# Patient Record
Sex: Male | Born: 1960 | Race: White | Hispanic: No | Marital: Single | Smoking: Current every day smoker
Health system: Southern US, Community
[De-identification: ages and names within clinical notes are randomized; demographics above are authoritative.]

## PROBLEM LIST (undated history)

## (undated) DIAGNOSIS — I2510 Atherosclerotic heart disease of native coronary artery without angina pectoris: Secondary | ICD-10-CM

## (undated) DIAGNOSIS — E785 Hyperlipidemia, unspecified: Secondary | ICD-10-CM

## (undated) DIAGNOSIS — I10 Essential (primary) hypertension: Secondary | ICD-10-CM

## (undated) HISTORY — DX: Hyperlipidemia, unspecified: E78.5

## (undated) HISTORY — PX: CORONARY ANGIOPLASTY WITH STENT PLACEMENT: SHX49

## (undated) HISTORY — DX: Essential (primary) hypertension: I10

---

## 2007-07-31 ENCOUNTER — Encounter: Admission: RE | Admit: 2007-07-31 | Discharge: 2007-07-31 | Payer: Self-pay | Admitting: Family Medicine

## 2009-11-29 ENCOUNTER — Ambulatory Visit: Payer: Self-pay | Admitting: Surgery

## 2010-05-23 ENCOUNTER — Ambulatory Visit: Payer: Self-pay | Admitting: Surgery

## 2010-08-16 NOTE — Assessment & Plan Note (Signed)
OFFICE VISIT   [name], [name]  DOB:  1960/09/12                                       11/29/2009  [id number]#:[id number]   REASON FOR VISIT:  Leg pain.   CHIEF COMPLAINT:  Leg pain.   HISTORY:  This is a 50 year old gentleman seen at the request of Dr.  Andrey Campanile for evaluation of leg pain.  The patient states that for the past  6 months he has been having problems  in his legs, the right leg being  greater than the left.  He states that he develops a sharp type pain in  his right flank and groin area that is mostly associated with walking  approximately 50 feet.  He also complains of a dull pain in his feet  that mainly happens at night.  His symptoms are alleviated with rest.  They are aggravated with walking.  When they come about, they usually go  away within a couple minutes, however, his pain has been noted to last  for several hours.   The patient suffers from hypertension and hypercholesterolemia, both  which are medically managed.  He has already undergone a coronary stent  placement.  He continues to be a smoker approximately 1 pack a day.   REVIEW OF SYSTEMS:  VASCULAR:  Positive for pain in the legs walking and  lying flat.  CARDIAC:  Positive for chest pain.  GI:  Positive for reflux.  NEURO:  Positive for headaches.  GU:  Positive for frequent urination.  ENT:  Positive for recent change in eyesight.  MUSCULOSKELETAL:  Positive for arthritis, joint pain, muscle pain.  PSYCH:  Positive for anxiety.   PAST MEDICAL HISTORY:  Hypertension, hypercholesterolemia, coronary  artery disease, degenerative back disease.   PAST SURGICAL HISTORY:  Coronary stent.   SOCIAL HISTORY:  He is single, disabled.  Smokes a pack a day, also  drinks alcohol regularly.   FAMILY HISTORY:  Positive for heart attack in his mother.   MEDICATIONS:  Include aspirin, Ativan, lisinopril/hydrochlorothiazide,  Loxitane, metoprolol, omeprazole, Plavix, simvastatin.   ALLERGIES:  None.   PHYSICAL EXAMINATION:  Heart rate 61, blood pressure 110/75, temperature  is 98.0.  GENERAL:  He is well-appearing, in no distress.  HEENT:  Within normal limits.  LUNGS:  Clear bilaterally.  CARDIOVASCULAR:  Regular rate and rhythm.  No murmur.  No carotid  bruits.  Pedal pulses are palpable.  Femoral pulses are 2+ and equal.  ABDOMEN:  Soft, nontender.  No pulsatile mass.  No hepatosplenomegaly.  MUSCULOSKELETAL:  Without major deformity or cyanosis.  NEURO:  He has no focal deficits.  SKIN:  Without rash.   DIAGNOSTICS:  I have ordered and independently reviewed his ultrasound.  This reveals an ankle brachial index of 1.0 on the right and 1.1 on the  left.  He has triphasic pedal waveforms.   ASSESSMENT/PLAN:  Bilateral leg pain.   PLAN:  I discussed today with the patient that the likelihood of his  symptoms being related to inadequate circulation is very low.  He comes  today with reports of significant degenerative back disease and  suspicion is that most of his complaints are related to his back and  disk disease.  I will plan on seeing him back in 6 months.  In the  interval, he is going to consider further physical therapy  versus  injections in his back.  If he has had no resolution of his pain even  with adequate management of his back disease, I would consider  proceeding with arteriogram to completely image his lower extremity  vasculature and to potentially completely exclude vascular  disease as  the source of his problems.   I did encourage the patient to consider cessation of tobacco as this is  certainly not helping his long-term risk stratification.  I spent  approximately 4 minutes talking to him about smoking cessation.  He  states that he is trying to quit.  I will plan on seeing the patient  back in 6 months.     Jorge Ny, MD  Electronically Signed   VWB/MEDQ  D:  11/29/2009  T:  11/30/2009  Job:  [phone]

## 2013-10-11 ENCOUNTER — Emergency Department (HOSPITAL_COMMUNITY): Payer: Medicaid Other

## 2013-10-11 ENCOUNTER — Encounter (HOSPITAL_COMMUNITY): Payer: Self-pay | Admitting: Emergency Medicine

## 2013-10-11 ENCOUNTER — Emergency Department (HOSPITAL_COMMUNITY)
Admission: EM | Admit: 2013-10-11 | Discharge: 2013-10-11 | Disposition: A | Discharge status: Home / Self Care | Payer: Medicaid Other | Attending: Emergency Medicine | Admitting: Emergency Medicine

## 2013-10-11 DIAGNOSIS — Z23 Encounter for immunization: Secondary | ICD-10-CM | POA: Diagnosis not present

## 2013-10-11 DIAGNOSIS — S46909A Unspecified injury of unspecified muscle, fascia and tendon at shoulder and upper arm level, unspecified arm, initial encounter: Secondary | ICD-10-CM | POA: Diagnosis not present

## 2013-10-11 DIAGNOSIS — S2232XA Fracture of one rib, left side, initial encounter for closed fracture: Secondary | ICD-10-CM

## 2013-10-11 DIAGNOSIS — S99929A Unspecified injury of unspecified foot, initial encounter: Secondary | ICD-10-CM

## 2013-10-11 DIAGNOSIS — F17200 Nicotine dependence, unspecified, uncomplicated: Secondary | ICD-10-CM | POA: Diagnosis not present

## 2013-10-11 DIAGNOSIS — I2510 Atherosclerotic heart disease of native coronary artery without angina pectoris: Secondary | ICD-10-CM | POA: Diagnosis not present

## 2013-10-11 DIAGNOSIS — S0990XA Unspecified injury of head, initial encounter: Secondary | ICD-10-CM | POA: Diagnosis present

## 2013-10-11 DIAGNOSIS — IMO0002 Reserved for concepts with insufficient information to code with codable children: Secondary | ICD-10-CM | POA: Diagnosis not present

## 2013-10-11 DIAGNOSIS — S01501A Unspecified open wound of lip, initial encounter: Secondary | ICD-10-CM | POA: Insufficient documentation

## 2013-10-11 DIAGNOSIS — S99919A Unspecified injury of unspecified ankle, initial encounter: Secondary | ICD-10-CM

## 2013-10-11 DIAGNOSIS — S2249XA Multiple fractures of ribs, unspecified side, initial encounter for closed fracture: Secondary | ICD-10-CM | POA: Diagnosis not present

## 2013-10-11 DIAGNOSIS — S8990XA Unspecified injury of unspecified lower leg, initial encounter: Secondary | ICD-10-CM | POA: Insufficient documentation

## 2013-10-11 DIAGNOSIS — S4980XA Other specified injuries of shoulder and upper arm, unspecified arm, initial encounter: Secondary | ICD-10-CM | POA: Diagnosis not present

## 2013-10-11 DIAGNOSIS — S2231XA Fracture of one rib, right side, initial encounter for closed fracture: Secondary | ICD-10-CM

## 2013-10-11 HISTORY — DX: Atherosclerotic heart disease of native coronary artery without angina pectoris: I25.10

## 2013-10-11 LAB — BASIC METABOLIC PANEL
ANION GAP: 22 — AB (ref 5–15)
BUN: 32 mg/dL — ABNORMAL HIGH (ref 6–23)
CALCIUM: 9 mg/dL (ref 8.4–10.5)
CO2: 15 meq/L — AB (ref 19–32)
Chloride: 103 mEq/L (ref 96–112)
Creatinine, Ser: 1.45 mg/dL — ABNORMAL HIGH (ref 0.50–1.35)
GFR, EST AFRICAN AMERICAN: 63 mL/min — AB (ref >90)
GFR, EST NON AFRICAN AMERICAN: 54 mL/min — AB (ref >90)
Glucose, Bld: 94 mg/dL (ref 70–99)
Potassium: 4.3 mEq/L (ref 3.7–5.3)
SODIUM: 140 meq/L (ref 137–147)

## 2013-10-11 LAB — CBC
HCT: 37.5 % — ABNORMAL LOW (ref 39.0–52.0)
Hemoglobin: 12.8 g/dL — ABNORMAL LOW (ref 13.0–17.0)
MCH: 31.7 pg (ref 26.0–34.0)
MCHC: 34.1 g/dL (ref 30.0–36.0)
MCV: 92.8 fL (ref 78.0–100.0)
PLATELETS: 229 10*3/uL (ref 150–400)
RBC: 4.04 MIL/uL — AB (ref 4.22–5.81)
RDW: 12.8 % (ref 11.5–15.5)
WBC: 18.9 10*3/uL — ABNORMAL HIGH (ref 4.0–10.5)

## 2013-10-11 MED ORDER — OXYCODONE-ACETAMINOPHEN 5-325 MG PO TABS
2.0000 | ORAL_TABLET | Freq: Once | ORAL | Status: AC
  Administered 2013-10-11: 2 via ORAL
  Filled 2013-10-11: qty 2

## 2013-10-11 MED ORDER — MORPHINE SULFATE 4 MG/ML IJ SOLN
4.0000 mg | Freq: Once | INTRAMUSCULAR | Status: AC
  Administered 2013-10-11: 4 mg via INTRAVENOUS
  Filled 2013-10-11: qty 1

## 2013-10-11 MED ORDER — HYDROMORPHONE HCL PF 1 MG/ML IJ SOLN
1.0000 mg | Freq: Once | INTRAMUSCULAR | Status: DC
  Filled 2013-10-11: qty 1

## 2013-10-11 MED ORDER — MORPHINE SULFATE 4 MG/ML IJ SOLN
6.0000 mg | Freq: Once | INTRAMUSCULAR | Status: AC
  Administered 2013-10-11: 6 mg via INTRAVENOUS
  Filled 2013-10-11: qty 2

## 2013-10-11 MED ORDER — SODIUM CHLORIDE 0.9 % IV BOLUS (SEPSIS)
1000.0000 mL | Freq: Once | INTRAVENOUS | Status: AC
  Administered 2013-10-11: 1000 mL via INTRAVENOUS

## 2013-10-11 MED ORDER — TETANUS-DIPHTH-ACELL PERTUSSIS 5-2.5-18.5 LF-MCG/0.5 IM SUSP
0.5000 mL | Freq: Once | INTRAMUSCULAR | Status: AC
  Administered 2013-10-11: 0.5 mL via INTRAMUSCULAR
  Filled 2013-10-11: qty 0.5

## 2013-10-11 MED ORDER — OXYCODONE-ACETAMINOPHEN 5-325 MG PO TABS: 1.0000 | ORAL_TABLET | Freq: Four times a day (QID) | ORAL | Status: DC | PRN

## 2013-10-11 MED ORDER — IOHEXOL 300 MG/ML  SOLN
100.0000 mL | Freq: Once | INTRAMUSCULAR | Status: AC | PRN
  Administered 2013-10-11: 100 mL via INTRAVENOUS
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
## 2013-10-11 NOTE — Discharge Instructions (Signed)
Rib Fracture °A rib fracture is a break or crack in one of the bones of the ribs. The ribs are a group of long, curved bones that wrap around your chest and attach to your spine. They protect your lungs and other organs in the chest cavity. A broken or cracked rib is often painful, but most do not cause other problems. Most rib fractures heal on their own over time. However, rib fractures can be more serious if multiple ribs are broken or if broken ribs move out of place and push against other structures. °CAUSES  °· A direct blow to the chest. For example, this could happen during contact sports, a car accident, or a fall against a hard object. °· Repetitive movements with high force, such as pitching a baseball or having severe coughing spells. °SYMPTOMS  °· Pain when you breathe in or cough. °· Pain when someone presses on the injured area. °DIAGNOSIS  °Your caregiver will perform a physical exam. Various imaging tests may be ordered to confirm the diagnosis and to look for related injuries. These tests may include a chest X-ray, computed tomography (CT), magnetic resonance imaging (MRI), or a bone scan. °TREATMENT  °Rib fractures usually heal on their own in 1-3 months. The longer healing period is often associated with a continued cough or other aggravating activities. During the healing period, pain control is very important. Medication is usually given to control pain. Hospitalization or surgery may be needed for more severe injuries, such as those in which multiple ribs are broken or the ribs have moved out of place.  °HOME CARE INSTRUCTIONS  °· Avoid strenuous activity and any activities or movements that cause pain. Be careful during activities and avoid bumping the injured rib. °· Gradually increase activity as directed by your caregiver. °· Only take over-the-counter or prescription medications as directed by your caregiver. Do not take other medications without asking your caregiver first. °· Apply ice  to the injured area for the first 1-2 days after you have been treated or as directed by your caregiver. Applying ice helps to reduce inflammation and pain. °¨ Put ice in a plastic bag. °¨ Place a towel between your skin and the bag.   °¨ Leave the ice on for 15-20 minutes at a time, every 2 hours while you are awake. °· Perform deep breathing as directed by your caregiver. This will help prevent pneumonia, which is a common complication of a broken rib. Your caregiver may instruct you to: °¨ Take deep breaths several times a day. °¨ Try to cough several times a day, holding a pillow against the injured area. °¨ Use a device called an incentive spirometer to practice deep breathing several times a day. °· Drink enough fluids to keep your urine clear or pale yellow. This will help you avoid constipation.   °· Do not wear a rib belt or binder. These restrict breathing, which can lead to pneumonia.   °SEEK IMMEDIATE MEDICAL CARE IF:  °· You have a fever.   °· You have difficulty breathing or shortness of breath.   °· You develop a continual cough, or you cough up thick or bloody sputum. °· You feel sick to your stomach (nausea), throw up (vomit), or have abdominal pain.   °· You have worsening pain not controlled with medications.   °MAKE SURE YOU: °· Understand these instructions. °· Will watch your condition. °· Will get help right away if you are not doing well or get worse. °Document Released: 03/20/2005 Document Revised: 11/20/2012 Document Reviewed:   05/22/2012 °ExitCare® Patient Information ©2015 [employer]. This information is not intended to replace advice given to you by your health care provider. Make sure you discuss any questions you have with your health care provider. ° ° °Emergency Department Resource Guide °1) Find a Doctor and Pay Out of Pocket °Although you won't have to find out who is covered by your insurance plan, it is a good idea to ask around and get recommendations. You will then need to  call the office and see if the doctor you have chosen will accept you as a new patient and what types of options they offer for patients who are self-pay. Some doctors offer discounts or will set up payment plans for their patients who do not have insurance, but you will need to ask so you aren't surprised when you get to your appointment. ° °2) Contact Your Local Health Department °Not all health departments have doctors that can see patients for sick visits, but many do, so it is worth a call to see if yours does. If you don't know where your local health department is, you can check in your phone book. The CDC also has a tool to help you locate your state's health department, and many state websites also have listings of all of their local health departments. ° °3) Find a Walk-in Clinic °If your illness is not likely to be very severe or complicated, you may want to try a walk in clinic. These are popping up all over the country in pharmacies, drugstores, and shopping centers. They're usually staffed by nurse practitioners or physician assistants that have been trained to treat common illnesses and complaints. They're usually fairly quick and inexpensive. However, if you have serious medical issues or chronic medical problems, these are probably not your best option. ° °No Primary Care Doctor: °- Call [facility] at  832-8000 - they can help you locate a primary care doctor that  accepts your insurance, provides certain services, etc. °- Physician Referral Service- 1-800-533-3463 ° °Chronic Pain Problems: °Organization         Address  Phone   Notes  °[facility]  (336) 297-2271 Patients need to be referred by their primary care doctor.  ° °Medication Assistance: °Organization         Address  Phone   Notes  °[place] [facility] [address] °[place],  [address] (336) 641-8030 --Must be a resident of [place] °-- Must have NO insurance  coverage whatsoever (no Medicaid/ Medicare, etc.) °-- The pt. MUST have a primary care doctor that directs their care regularly and follows them in the community °  °[facility]  (866) 331-1348   °[employer]  (888) 892-1162   ° °Agencies that provide inexpensive medical care: °Organization         Address  Phone   Notes  °[facility]  (336) 832-8035   °[facility]    (336) 832-7272   °[facility] [address] °[place],  [address] (336) 832-4777   °[facility] [address], °[place] (336) 271-4999   °[facility]    (336) 373-0678   °[facility]    (336) 272-1050   °[facility] ° [address], [place] Phone:  (336) 832-4444, Fax:  (336) 832-4440 Hours of Operation:  9 am - 6 pm, M-F.  Also accepts Medicaid/Medicare and self-pay.  °[facility] ° [address], [place] Phone: (336) 832-3150, Fax: (336) [phone]. Hours of Operation:  8:30 am - 5:30 pm, M-F.  Also accepts Medicaid and self-pay.  °[facility] [place] [address], [place] Phone: (336) 878-6027   °[facility] [address], [place], [place] (336)723-1848, Ext. 123 Mondays & Thursdays: 7-9 AM.  First 15 patients are seen on a first come, first serve basis. °  ° °Medicaid-accepting [place] Providers: ° °Organization         Address  Phone   Notes  °[facility] [address], [place] (336) 641-2100 Also accepts self-pay patients.  °[facility] [address], [place] ° (336) 856-9996   °[facility] [address], [place] (336) 288-8857   °[facility] [address] [place] [address], [place] (336) 299-7000   °Veita Bland [address], [place]  ° (336) 373-1557 Only accepts [place] Access Medicaid patients after they have their  name applied to their card.  ° °Self-Pay (no insurance) in [place]: ° °Organization         Address  Phone   Notes  °Sickle Cell Patients, [facility] [address], [place] (336) 832-1970   °[facility] [address], [place] (336) 832-4400   °[facility] ° [address], [place] (336) 992-4800   °[facility]/Dr. Osei-Bonsu ° [address] [place] [address], [place] or [address], [place] (336) [phone] Phone number for both [place] and [place] locations is the same.  °[facility] [address], [place] (336) 299-0000   °[facility] [address] [place] [address], [place] or [address] (336) 852-7530 °(336) 878-2260   °[facility] [address], [place] (336) 350-1642, phone; (336) 294-5005, fax Sees patients 1st and 3rd Saturday of every month.  Must not qualify for public or private insurance (i.e. Medicaid, Medicare, [place] Health Choice, Veterans' Benefits) • Household income should be no more than 200% of the poverty level •The clinic cannot treat you if you are pregnant or think you are pregnant • Sexually transmitted diseases are not treated at the clinic.  ° ° °Dental Care: °Organization         Address  Phone  Notes  °[facility] [address], [place] (336) 641-6152 Accepts children up to age 21 who are enrolled in Medicaid or [place] Health Choice; pregnant women with a Medicaid card; and children who have applied for Medicaid or [place] Health Choice, but were declined, whose parents can pay a reduced fee at time of service.  °[facility] [place]  [address], [place] (336) 641-7733 Accepts children up to age 21 who are enrolled in Medicaid or [place] Health Choice; pregnant women with a Medicaid card; and children who have applied for  Medicaid or [place] Health Choice, but were declined, whose parents can pay a reduced fee at time of service.  °[facility] ° [address], [place] (336) 641-4533 Patients are seen by appointment only. Walk-ins are not accepted. [facility] will see patients 18 years of age and older. °Monday - Tuesday (8am-5pm) °Most Wednesdays (8:30-5pm) °$30 per visit,   cash only  °[facility] ° [address], [place] (336) 641-4533 Patients are seen by appointment only. Walk-ins are not accepted. [facility] will see patients 18 years of age and older. °One Wednesday Evening (Monthly: Volunteer Based).  $30 per visit, cash only  °[facility]  (919) 537-3737 for adults; Children under age 4, call Graduate Pediatric Dentistry at (919) [phone]. Children aged 4-14, please call (919) [phone] to request a pediatric application. ° Dental services are provided in all areas of dental care including fillings, crowns and bridges, complete and partial dentures, implants, gum treatment, root canals, and extractions. Preventive care is also provided. Treatment is provided to both adults and children. °Patients are selected via a lottery and there is often a waiting list. °  °[facility] [address], °[place] ° (336) 763-8833 [email or web address] °  °[facility] [address], [place], [place] (336)723-1848, Ext. 123 Second and Fourth Thursday of each month, opens at 6:30 AM; Clinic ends at 9 AM.  Patients are seen on a first-come first-served basis, and a limited number are seen during each clinic.  ° °[facility] ° [address], [place], [place] (336) 723-7904   Eligibility Requirements °You must have lived in [place], or [place] counties for at least the last three months. °  You cannot be eligible for state or federal sponsored healthcare insurance, including Veterans Administration, Medicaid,  or Medicare. °  You generally cannot be eligible for healthcare insurance through your employer.  °  How to apply: °Eligibility screenings are held every Tuesday and Wednesday afternoon from 1:00 pm until 4:00 pm. You do not need an appointment for the interview!  °[facility] [address], [place], [place] 336-631-2330   °[facility]  336-342-8273   °[facility]  336-703-3100   °[facility]  336-570-6415   ° °Behavioral Health Resources in the Community: °Intensive Outpatient Programs °Organization         Address  Phone  Notes  °[facility] [address], [place], [place] 336-878-6098   °[facility] [address], [place], [place] 336-832-9800   °[facility] [address], [place], [place] ° 336-882-2125   °[facility] [address],  °[place], [place] 1-800-853-5163 or 336-641-4981   °Substance Abuse Resources °Organization         Address  Phone  Notes  °[facility]  336-882-2125   °[facility]  336-784-9470   °[facility]  336-285-9073   °[facility]  336-845-3988   °Residential & Outpatient Substance Abuse Program  1-800-659-3381   °Psychological Services °Organization         Address  Phone  Notes  °[facility]  336- 832-9600   °[facility]  336- 378-7881   °[facility] [address], [place] 1-800-853-5163 or 336-641-4981   ° °Mobile Crisis Teams °Organization         Address  Phone  Notes  °[facility], Mobile Crisis Care Unit  1-877-626-1772   °Assertive °[facility] ° [address] [place], [place] 336-834-9664   °Sharon DeEsch [address] °[place] 336-554-5454   ° °Self-Help/Support Groups °Organization         Address  Phone               Notes  °[facility] - variety of support groups   336- 373-1402 Call for more information  °Narcotics Anonymous (NA), [facility] [address], °[place]  2 meetings at this location  ° °Residential Treatment Programs °Organization         Address  Phone  Notes  °[facility] [address],    °[place]  1-866-801-8205   °[facility] ° [address], [place], [place] 704-293-8524   °[facility] [address], [place] 336-845-3988 Admissions: 8am-3pm M-F  °[facility] [address]    °[place], [place] 336-841-1104   °[facility] [address], [place], [place] 336-379-7146   °[facility] [address]  °[place], [place] 336-285-9073   °[facility] - Intensive Outpatient [address], [place], [place] 336-852-3033   °[facility] [address]  °[place], [place] 1-877-615-2722 or 336-784-9470   °[facility] [address] [place], [place] 336-227-7417 Accepts Medicaid  °[facility] [address]  °[place] 1-800-659-3381 Substance Abuse/Addiction Treatment  ° °[place] Behavioral Health Resources °Organization         Address  Phone  Notes  °[facility]  (888) 581-9988   °Julie Brannon, PhD [address] [place], [place]   (336) 349-5553 or (336) 951-0000   °[facility]   [address] °[place], [place] (336) 349-4454   °[facility] [address], [place], [place] (336) 342-8316 Insurance/Medicaid/sponsorship through [facility]  °[facility] [address]                                    [place], [place] (336) 342-8316 Therapy/tele-psych/case  °[facility] [address]  ° [place], [place] (336) 349-2233    °Dr. Arfeen  (336) 349-4544   °[facility] [place]  [employer] [place] [facility] 1) [address], [place] °2) [address], [place] °3)  [address], [place] (336) 349-3220 °(336) 342-7768 ° °(336) 342-8140   °[place] [facility] (336) 342-1394 or (336) 342-3537 (After Hours)    ° ° ° °

## 2013-10-11 NOTE — ED Provider Notes (Signed)
LACERATION REPAIR Performed by: Raymon MuttonSciacca, Anahid Eskelson Authorized by: Raymon MuttonSciacca, Azara Gemme Consent: Verbal consent obtained. Risks and benefits: risks, benefits and alternatives were discussed Consent given by: patient Patient identity confirmed: provided demographic data Prepped and Draped in normal sterile fashion Wound explored Laceration Location: Left aspect of lower lip Laceration Length: 2 cm No Foreign Bodies seen or palpated Anesthesia: local infiltration Local anesthetic: lidocaine 2 % without epinephrine Anesthetic total: 5 ml Irrigation method: syringe Amount of cleaning: standard Skin closure: Approximate  Number of sutures: 3  Technique: Superficial single interrupted using 6-0 Ethilon  Patient tolerance: Patient tolerated the procedure well with no immediate complications. Good hemostasis.  Educated patient on proper wound care. Discussed with patient to wash with warm water and soap. Discussed patient had dry. Discussed with patient to avoid biting the lip or lifting the wound. Discussed with patient that he is to report back to the ED with an approximate 4-5 days for wound to be reassessed for sutures to be removed. Patient understood and agreed to plan of care.  Raymon MuttonMarissa Jesson Foskey, PA-C 10/11/13 1831

## 2013-10-12 NOTE — ED Provider Notes (Signed)
Medical screening examination/treatment/procedure(s) were conducted as a shared visit with non-physician practitioner(s) and myself.  I personally evaluated the patient during the encounter.   EKG Interpretation None      I supervised the laceration repair.  Dagmar HaitWilliam Danney Bungert, MD 10/12/13 [phone]
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
## 2016-11-24 ENCOUNTER — Encounter (HOSPITAL_COMMUNITY): Payer: Self-pay

## 2016-11-24 ENCOUNTER — Inpatient Hospital Stay (HOSPITAL_COMMUNITY)
Admission: RE | Admit: 2016-11-24 | Discharge: 2016-11-30 | DRG: 897 | Disposition: A | Discharge status: Home / Self Care | Payer: Medicaid Other | Attending: Psychiatry | Admitting: Psychiatry

## 2016-11-24 DIAGNOSIS — F19929 Other psychoactive substance use, unspecified with intoxication, unspecified: Secondary | ICD-10-CM

## 2016-11-24 DIAGNOSIS — G4700 Insomnia, unspecified: Secondary | ICD-10-CM | POA: Diagnosis present

## 2016-11-24 DIAGNOSIS — F17210 Nicotine dependence, cigarettes, uncomplicated: Secondary | ICD-10-CM | POA: Diagnosis present

## 2016-11-24 DIAGNOSIS — F1024 Alcohol dependence with alcohol-induced mood disorder: Principal | ICD-10-CM | POA: Diagnosis present

## 2016-11-24 DIAGNOSIS — F332 Major depressive disorder, recurrent severe without psychotic features: Secondary | ICD-10-CM | POA: Diagnosis not present

## 2016-11-24 DIAGNOSIS — R45851 Suicidal ideations: Secondary | ICD-10-CM | POA: Diagnosis present

## 2016-11-24 DIAGNOSIS — F3289 Other specified depressive episodes: Secondary | ICD-10-CM

## 2016-11-24 DIAGNOSIS — F10229 Alcohol dependence with intoxication, unspecified: Secondary | ICD-10-CM | POA: Diagnosis present

## 2016-11-24 DIAGNOSIS — F1020 Alcohol dependence, uncomplicated: Secondary | ICD-10-CM | POA: Clinically undetermined

## 2016-11-24 DIAGNOSIS — F1994 Other psychoactive substance use, unspecified with psychoactive substance-induced mood disorder: Secondary | ICD-10-CM | POA: Clinically undetermined

## 2016-11-24 DIAGNOSIS — F1290 Cannabis use, unspecified, uncomplicated: Secondary | ICD-10-CM | POA: Diagnosis not present

## 2016-11-24 DIAGNOSIS — Z811 Family history of alcohol abuse and dependence: Secondary | ICD-10-CM

## 2016-11-24 DIAGNOSIS — F39 Unspecified mood [affective] disorder: Secondary | ICD-10-CM | POA: Diagnosis not present

## 2016-11-24 DIAGNOSIS — F419 Anxiety disorder, unspecified: Secondary | ICD-10-CM | POA: Diagnosis not present

## 2016-11-24 DIAGNOSIS — R451 Restlessness and agitation: Secondary | ICD-10-CM | POA: Diagnosis not present

## 2016-11-24 MED ORDER — TRAZODONE HCL 100 MG PO TABS
200.0000 mg | ORAL_TABLET | Freq: Every day | ORAL | Status: DC
  Administered 2016-11-24 – 2016-11-29 (×6): 200 mg via ORAL
  Filled 2016-11-24 (×10): qty 2

## 2016-11-24 MED ORDER — LISINOPRIL 20 MG PO TABS
20.0000 mg | ORAL_TABLET | Freq: Every day | ORAL | Status: DC
  Administered 2016-11-24 – 2016-11-30 (×6): 20 mg via ORAL
  Filled 2016-11-24 (×10): qty 1

## 2016-11-24 MED ORDER — THIAMINE HCL 100 MG/ML IJ SOLN: 100.0000 mg | Freq: Once | INTRAMUSCULAR | Status: DC

## 2016-11-24 MED ORDER — MAGNESIUM HYDROXIDE 400 MG/5ML PO SUSP
30.0000 mL | Freq: Every day | ORAL | Status: DC | PRN
  Administered 2016-11-26 – 2016-11-28 (×3): 30 mL via ORAL
  Filled 2016-11-24 (×3): qty 30

## 2016-11-24 MED ORDER — ATORVASTATIN CALCIUM 20 MG PO TABS
20.0000 mg | ORAL_TABLET | Freq: Every day | ORAL | Status: DC
  Administered 2016-11-24 – 2016-11-29 (×6): 20 mg via ORAL
  Filled 2016-11-24 (×5): qty 1
  Filled 2016-11-24: qty 2
  Filled 2016-11-24 (×2): qty 1

## 2016-11-24 MED ORDER — GABAPENTIN 300 MG PO CAPS
300.0000 mg | ORAL_CAPSULE | Freq: Three times a day (TID) | ORAL | Status: DC
  Administered 2016-11-24 – 2016-11-30 (×18): 300 mg via ORAL
  Filled 2016-11-24 (×25): qty 1

## 2016-11-24 MED ORDER — CHLORDIAZEPOXIDE HCL 25 MG PO CAPS
25.0000 mg | ORAL_CAPSULE | Freq: Four times a day (QID) | ORAL | Status: DC | PRN
  Administered 2016-11-24 – 2016-11-25 (×2): 25 mg via ORAL
  Filled 2016-11-24 (×2): qty 1

## 2016-11-24 MED ORDER — VITAMIN B-1 100 MG PO TABS
100.0000 mg | ORAL_TABLET | Freq: Every day | ORAL | Status: DC
  Administered 2016-11-25 – 2016-11-30 (×6): 100 mg via ORAL
  Filled 2016-11-24 (×8): qty 1

## 2016-11-24 MED ORDER — VITAMIN D3 25 MCG (1000 UNIT) PO TABS
1000.0000 [IU] | ORAL_TABLET | Freq: Every day | ORAL | Status: DC
  Administered 2016-11-25 – 2016-11-30 (×6): 1000 [IU] via ORAL
  Filled 2016-11-24 (×9): qty 1

## 2016-11-24 MED ORDER — DIAZEPAM 2 MG PO TABS: 2.0000 mg | ORAL_TABLET | Freq: Three times a day (TID) | ORAL | Status: DC | PRN

## 2016-11-24 MED ORDER — ACETAMINOPHEN 325 MG PO TABS
650.0000 mg | ORAL_TABLET | Freq: Four times a day (QID) | ORAL | Status: DC | PRN
  Administered 2016-11-26 (×2): 650 mg via ORAL
  Filled 2016-11-24 (×2): qty 2

## 2016-11-24 MED ORDER — ALUM & MAG HYDROXIDE-SIMETH 200-200-20 MG/5ML PO SUSP
30.0000 mL | ORAL | Status: DC | PRN
  Administered 2016-11-26 – 2016-11-30 (×5): 30 mL via ORAL
  Filled 2016-11-24 (×5): qty 30

## 2016-11-24 MED ORDER — LOPERAMIDE HCL 2 MG PO CAPS: 2.0000 mg | ORAL_CAPSULE | ORAL | Status: AC | PRN

## 2016-11-24 MED ORDER — ONDANSETRON 4 MG PO TBDP: 4.0000 mg | ORAL_TABLET | Freq: Four times a day (QID) | ORAL | Status: AC | PRN

## 2016-11-24 MED ORDER — HYDROXYZINE HCL 25 MG PO TABS
25.0000 mg | ORAL_TABLET | Freq: Four times a day (QID) | ORAL | Status: DC | PRN
  Administered 2016-11-25: 25 mg via ORAL
  Filled 2016-11-24: qty 1

## 2016-11-24 MED ORDER — TRAZODONE HCL 50 MG PO TABS
50.0000 mg | ORAL_TABLET | Freq: Every evening | ORAL | Status: DC | PRN
Start: 2016-11-24 — End: 2016-11-25

## 2016-11-24 MED ORDER — ADULT MULTIVITAMIN W/MINERALS CH
1.0000 | ORAL_TABLET | Freq: Every day | ORAL | Status: DC
  Administered 2016-11-25: 1 via ORAL
  Filled 2016-11-24 (×3): qty 1

## 2016-11-24 MED ORDER — ENSURE ENLIVE PO LIQD
237.0000 mL | Freq: Two times a day (BID) | ORAL | Status: DC
  Administered 2016-11-25 – 2016-11-30 (×7): 237 mL via ORAL

## 2016-11-24 MED ORDER — CITALOPRAM HYDROBROMIDE 40 MG PO TABS
40.0000 mg | ORAL_TABLET | Freq: Every day | ORAL | Status: DC
  Administered 2016-11-24: 20 mg via ORAL
  Administered 2016-11-25: 40 mg via ORAL
  Filled 2016-11-24: qty 1
  Filled 2016-11-24: qty 2
  Filled 2016-11-24 (×2): qty 1

## 2016-11-24 MED ORDER — HYDROXYZINE HCL 25 MG PO TABS
25.0000 mg | ORAL_TABLET | Freq: Three times a day (TID) | ORAL | Status: DC | PRN
  Administered 2016-11-24: 25 mg via ORAL
  Filled 2016-11-24: qty 1

## 2016-11-24 NOTE — BH Assessment (Signed)
Assessment Note  Ricardo [name] is an 56 y.o. male. Pt was cooperative with assessment counselor an answered questions appropriately. He came into the ED intoxicated with a blood alcohol level of .36. He states that he has been feeling suicidal and had been having thoughts to kill himself in "an unusual way." He states that he has "two kegs of gun powder and he would like to blow himself up so there is nothing left." He states that he is a chronic alcoholic and is drinking 1.5th of liquor a day. He states that he is "worthless and good for nothing." and doesn't want to live anymore. He currently stays with his mom whom he takes care of. He states that she is a trigger for him and is constantly criticizing how he is taking care of her and cleaning the house. Pt denies having ant other family support. He states that he has done AA in the past and it was the "best two years of his life." Pt also goes to [facility] for his medication management and has a therapist there. He states that he doe not feel it is enough support for him right now and feels like he drinks to "make him forget." Pt states he hears voices sometimes but they "don't say anything bad." Hs also endorsees having visual hallucinations from time to time and states the earlier he was trying to pick thing up off the floor but they weren't there. It is unclear whether this is induced by drinking or underlying psychiatric condition. Pt does have a history of depression and anxiety.   Diagnosis: Bipolar 1 Disorder, Current episode, Depressed.                      Alcohol Use Disorder, Severe  Past Medical History:  Past Medical History:  Diagnosis Date  . Coronary artery disease     History reviewed. No pertinent surgical history.  Family History: History reviewed. No pertinent family history.  Social History:  reports that he has been smoking Cigarettes.  He has never used smokeless tobacco. He reports that he drinks alcohol. He reports that he uses  drugs, including Marijuana.  Additional Social History:  Alcohol / Drug Use Pain Medications: See MAR  Prescriptions: See MAR Over the Counter: Se MAR History of alcohol / drug use?: Yes Substance #1 Name of Substance 1: Alochol  1 - Age of First Use: UTA 1 - Amount (size/oz): UTA 1 - Frequency: UTA 1 - Duration: UTA 1 - Last Use / Amount: UTA  CIWA: CIWA-Ar BP: (!) 116/96 (RN notified) Pulse Rate: (!) 102 (RN notified) Nausea and Vomiting: no nausea and no vomiting Tactile Disturbances: none Tremor: three Auditory Disturbances: not present Paroxysmal Sweats: no sweat visible Visual Disturbances: not present Anxiety: mildly anxious Headache, Fullness in Head: none present Agitation: normal activity Orientation and Clouding of Sensorium: cannot do serial additions or is uncertain about date CIWA-Ar Total: 5 COWS:    Allergies: No Known Allergies  Home Medications:  Medications Prior to Admission  Medication Sig Dispense Refill  . doxepin (SINEQUAN) 100 MG capsule Take 200 mg by mouth at bedtime.    Marland Kitchen lisinopril-hydrochlorothiazide (PRINZIDE,ZESTORETIC) 20-25 MG per tablet Take 1 tablet by mouth daily.    Marland Kitchen loxapine (LOXITANE) 25 MG capsule Take 25 mg by mouth at bedtime.    . meloxicam (MOBIC) 7.5 MG tablet Take 7.5 mg by mouth 2 (two) times daily as needed for pain.    . metoprolol  succinate (TOPROL-XL) 25 MG 24 hr tablet Take 25 mg by mouth daily.    . mirtazapine (REMERON) 15 MG tablet Take 15 mg by mouth at bedtime.    Marland Kitchen oxyCODONE-acetaminophen (PERCOCET) 5-325 MG per tablet Take 1-2 tablets by mouth every 6 (six) hours as needed for moderate pain. 45 tablet 0  . simvastatin (ZOCOR) 80 MG tablet Take 80 mg by mouth daily.      OB/GYN Status:  No LMP for male patient.  General Assessment Data [facility] of Assessment[facility] TTS Assessment: Out of system Is this a Tele or Face-to-Face Assessment?: Tele Assessment Is this an Initial Assessment or a  Re-assessment for this encounter?: Initial Assessment Marital status:  (UTA) Living Arrangements: Other (Comment) (with family. ) Can pt return to current living arrangement?:  (UTA) Admission Status: Voluntary Is patient capable of signing voluntary admission?: Yes Referral Source: Other (UTA) Insurance type: Medicaid.     Crisis Care Plan Living Arrangements: Other (Comment) (with family. ) Legal Guardian:  (UTA) Name of Psychiatrist: UTA Name of Therapist: UTA  Education Status Is patient currently in school?:  (UTA) Current Grade: UTA Highest grade of school patient has completed: UTA Name of school: UTA Contact person: UTA  Risk to self with the past 6 months Suicidal Ideation: Yes-Currently Present Has patient been a risk to self within the past 6 months prior to admission? : Yes Suicidal Intent:  (UTA) Has patient had any suicidal intent within the past 6 months prior to admission? :  (UTA) Is patient at risk for suicide?: Yes Suicidal Plan?: Yes-Currently Present Specify Current Suicidal Plan: Per pt's mother, thoughts to kill himself in an unusual way.  Access to Means:  (UTA) What has been your use of drugs/alcohol within the last 12 months?: Alochol Previous Attempts/Gestures:  (UTA) How many times?:  (UTA) Other Self Harm Risks: UTA Triggers for Past Attempts:  (UTA) Intentional Self Injurious Behavior:  (UTA) Family Suicide History: Unable to assess Recent stressful life event(s):  (UTA) Persecutory voices/beliefs?:  (UTA) Depression: Yes Depression Symptoms: Feeling worthless/self pity, Guilt (hopelessness, sadness,) Substance abuse history and/or treatment for substance abuse?:  (UTA) Suicide prevention information given to non-admitted patients:  (UTA)  Risk to Others within the past 6 months Homicidal Ideation: No Does patient have any lifetime risk of violence toward others beyond the six months prior to admission? : No Thoughts of Harm to Others:  No Current Homicidal Intent: No Current Homicidal Plan: No Access to Homicidal Means: No Identified Victim: NA History of harm to others?: No Assessment of Violence: None Noted Violent Behavior Description: NA Does patient have access to weapons?:  (UTA) Criminal Charges Pending?:  (UTA) Does patient have a court date:  (UTA) Is patient on probation?:  (UTA)  Psychosis Hallucinations: Auditory, Visual Delusions: None noted  Mental Status Report Appearance/Hygiene: In scrubs Eye Contact: Unable to Assess Motor Activity: Unable to assess Speech: Logical/coherent (Per chart. ) Level of Consciousness: Unable to assess Mood: Depressed (Per chart. ) Affect: Depressed (Per chart.) Anxiety Level: Minimal Thought Processes: Unable to Assess Judgement: Unable to Assess Orientation: Unable to assess Obsessive Compulsive Thoughts/Behaviors: Unable to Assess  Cognitive Functioning Concentration: Unable to Assess Memory: Unable to Assess IQ: Average Insight: Unable to Assess Impulse Control: Unable to Assess Appetite:  (UTA) Sleep: Unable to Assess  ADLScreening [facility] Assessment Services) Patient's cognitive ability adequate to safely complete daily activities?: Yes (Per chart.) Patient able to express need for assistance with ADLs?: Yes (Per chart.)  Independently performs ADLs?: No (Per chart. )  Prior Inpatient Therapy Prior Inpatient Therapy:  (UTA) Prior Therapy Dates: UTA Prior Therapy Facilty/Provider(s): UTA Reason for Treatment: UTA  Prior Outpatient Therapy Prior Outpatient Therapy: Yes Prior Therapy Dates: [facility] Prior Therapy Facilty/Provider(s): Medication management and counseling  Reason for Treatment: UTA Does patient have an ACCT team?: Unknown Does patient have Intensive In-House Services?  : Unknown Does patient have [facility] services? : Unknown Does patient have P4CC services?: Unknown  ADL Screening (condition at time of admission) Patient's  cognitive ability adequate to safely complete daily activities?: Yes (Per chart.) Is the patient deaf or have difficulty hearing?: No (Per chart.) Does the patient have difficulty seeing, even when wearing glasses/contacts?: No (Per chart.) Does the patient have difficulty concentrating, remembering, or making decisions?: No (Per chart.) Patient able to express need for assistance with ADLs?: Yes (Per chart.) Does the patient have difficulty dressing or bathing?: No (Per chart.) Independently performs ADLs?: No (Per chart. ) Communication: Independent (Per chart. ) Dressing (OT): Independent (Per chart. ) Grooming: Independent (Per chart. ) Feeding: Independent (Per chart. ) Bathing: Independent (Per chart. ) Toileting: Independent (Per chart. ) In/Out Bed: Independent (Per chart. ) Walks in Home: Independent (Per chart. ) Does the patient have difficulty walking or climbing stairs?: No (Per chart.) Weakness of Legs: None (Per chart.) Weakness of Arms/Hands: None (Per chart.)  Home Assistive Devices/Equipment Home Assistive Devices/Equipment: None  Therapy Consults (therapy consults require a physician order) PT Evaluation Needed: No OT Evalulation Needed: No SLP Evaluation Needed: No Abuse/Neglect Assessment (Assessment to be complete while patient is alone) Physical Abuse: Denies (Per chart.) Verbal Abuse: Denies (Per chart.) Sexual Abuse: Denies (Per chart.) Exploitation of patient/patient's resources: Denies (Per chart.) Self-Neglect: Denies (Per chart.) Values / Beliefs Cultural Requests During Hospitalization: None Spiritual Requests During Hospitalization: None Consults Spiritual Care Consult Needed: No Social Work Consult Needed: No Merchant navy officer (For Healthcare) Does Patient Have a Medical Advance Directive?: No Would patient like information on creating a medical advance directive?: No - Patient declined Nutrition Screen- [facility] Adult/WL/AP Patient's home diet:  Regular Has the patient recently lost weight without trying?: Yes, 14-23 lbs. Has the patient been eating poorly because of a decreased appetite?: No Malnutrition Screening Tool Score: 2  Additional Information 1:1 In Past 12 Months?: No CIRT Risk: No Elopement Risk: No     Disposition: Tanika, NP recommends inpatient treatment.  Disposition Initial Assessment Completed for this Encounter: Yes Disposition of Patient: Inpatient treatment program Type of inpatient treatment program: Adult  On Site Evaluation by:   Reviewed with Physician:  Alcario Drought, NP  Redmond Pulling 11/24/2016 11:27 PM

## 2016-11-24 NOTE — Progress Notes (Signed)
D    Pt isolates to his room   He is having some mild withdrawal symptoms   He endorses some depression and anxiety tremors and sweats  He did not attend group   He reports he just feels bad and all he wants to do is lay in bed A    Verbal support given   Medications administered and effectiveness monitored    Q 15 min checks   Discuss withdrawal symptoms and medications to relieve those symptoms R   Pt verbalized understanding and is safe and receptive to verbal support

## 2016-11-24 NOTE — Progress Notes (Signed)
Admission Note: Patient is an 56 year old male admitted to the unit for symptoms of depression and suicidal thoughts.  Patient verbally contracts for safety.  Patient is alert and oriented x 4.  Patient presents with flat affect and depressed mood.  Patient states he is tired of living like this.  States his family don't want anything to do with him.  Admission plan of care reviewed and consent signed.  Personal belonging searched.  No contraband found.  Skin assessment completed.  Skin is dry, face is flushed and ruddy.  Patient observed shaking and tremulous during assessment.  Patient oriented to the unit, staff and room.  Routine safety checks initiated.  Patient is safe on the unit.

## 2016-11-24 NOTE — Tx Team (Signed)
Initial Treatment Plan 11/24/2016 6:51 PM Ricardo [name] LHT:[id number]    PATIENT STRESSORS: Financial difficulties Health problems Substance abuse   PATIENT STRENGTHS: Average or above average intelligence Communication skills Motivation for treatment/growth   PATIENT IDENTIFIED PROBLEMS: "Can't go on like this"  "Can't take it anymore"  Depression  Polysubstance Abuse  Suicidal thoughts             DISCHARGE CRITERIA:  Ability to meet basic life and health needs Medical problems require only outpatient monitoring Verbal commitment to aftercare and medication compliance  PRELIMINARY DISCHARGE PLAN: Attend aftercare/continuing care group Outpatient therapy Placement in alternative living arrangements  PATIENT/FAMILY INVOLVEMENT: This treatment plan has been presented to and reviewed with the patient, Ricardo [name], and/or family member.  The patient and family have been given the opportunity to ask questions and make suggestions.  Clarene Critchley, RN 11/24/2016, 6:51 PM

## 2016-11-25 ENCOUNTER — Encounter (HOSPITAL_COMMUNITY): Payer: Self-pay | Admitting: Psychiatry

## 2016-11-25 DIAGNOSIS — R45851 Suicidal ideations: Secondary | ICD-10-CM

## 2016-11-25 DIAGNOSIS — F1994 Other psychoactive substance use, unspecified with psychoactive substance-induced mood disorder: Secondary | ICD-10-CM

## 2016-11-25 DIAGNOSIS — F3289 Other specified depressive episodes: Secondary | ICD-10-CM

## 2016-11-25 DIAGNOSIS — F332 Major depressive disorder, recurrent severe without psychotic features: Secondary | ICD-10-CM

## 2016-11-25 DIAGNOSIS — F17210 Nicotine dependence, cigarettes, uncomplicated: Secondary | ICD-10-CM

## 2016-11-25 DIAGNOSIS — F1020 Alcohol dependence, uncomplicated: Secondary | ICD-10-CM | POA: Clinically undetermined

## 2016-11-25 DIAGNOSIS — F19929 Other psychoactive substance use, unspecified with intoxication, unspecified: Secondary | ICD-10-CM

## 2016-11-25 MED ORDER — CHLORDIAZEPOXIDE HCL 25 MG PO CAPS
25.0000 mg | ORAL_CAPSULE | Freq: Four times a day (QID) | ORAL | Status: AC | PRN
  Administered 2016-11-25 – 2016-11-27 (×6): 25 mg via ORAL
  Filled 2016-11-25 (×5): qty 1

## 2016-11-25 MED ORDER — NICOTINE 21 MG/24HR TD PT24
21.0000 mg | MEDICATED_PATCH | Freq: Every day | TRANSDERMAL | Status: DC
  Administered 2016-11-25 – 2016-11-30 (×6): 21 mg via TRANSDERMAL
  Filled 2016-11-25 (×7): qty 1

## 2016-11-25 MED ORDER — ADULT MULTIVITAMIN W/MINERALS CH
1.0000 | ORAL_TABLET | Freq: Every day | ORAL | Status: DC
  Administered 2016-11-26 – 2016-11-30 (×5): 1 via ORAL
  Filled 2016-11-25 (×7): qty 1

## 2016-11-25 MED ORDER — CHLORDIAZEPOXIDE HCL 25 MG PO CAPS
25.0000 mg | ORAL_CAPSULE | Freq: Three times a day (TID) | ORAL | Status: AC
  Administered 2016-11-26 – 2016-11-27 (×3): 25 mg via ORAL
  Filled 2016-11-25 (×4): qty 1

## 2016-11-25 MED ORDER — CHLORDIAZEPOXIDE HCL 25 MG PO CAPS
25.0000 mg | ORAL_CAPSULE | Freq: Four times a day (QID) | ORAL | Status: AC
  Administered 2016-11-25 – 2016-11-26 (×4): 25 mg via ORAL
  Filled 2016-11-25 (×4): qty 1

## 2016-11-25 MED ORDER — HYDROXYZINE HCL 50 MG PO TABS
50.0000 mg | ORAL_TABLET | Freq: Three times a day (TID) | ORAL | Status: DC | PRN
  Administered 2016-11-25 – 2016-11-30 (×10): 50 mg via ORAL
  Filled 2016-11-25 (×11): qty 1

## 2016-11-25 MED ORDER — CHLORDIAZEPOXIDE HCL 25 MG PO CAPS
25.0000 mg | ORAL_CAPSULE | ORAL | Status: AC
  Administered 2016-11-27 – 2016-11-28 (×2): 25 mg via ORAL
  Filled 2016-11-25 (×2): qty 1

## 2016-11-25 MED ORDER — CHLORDIAZEPOXIDE HCL 25 MG PO CAPS
25.0000 mg | ORAL_CAPSULE | Freq: Every day | ORAL | Status: AC
  Administered 2016-11-29: 25 mg via ORAL
  Filled 2016-11-25: qty 1

## 2016-11-25 MED ORDER — VITAMIN B-1 100 MG PO TABS: 100.0000 mg | ORAL_TABLET | Freq: Every day | ORAL | Status: DC
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
## 2016-11-25 NOTE — [facility] Suicide Risk Assessment (Signed)
[facility] Admission Suicide Risk Assessment   Nursing information obtained from:  Patient Demographic factors:  Male Current Mental Status:  Self-harm thoughts Loss Factors:  Financial problems / change in socioeconomic status Historical Factors:  Prior suicide attempts Risk Reduction Factors:  NA  Total Time spent with patient: 30 minutes Principal Problem: Substance or medication-induced depressive disorder with onset during intoxication [facility]) Diagnosis:   Patient Active Problem List   Diagnosis Date Noted  . Alcohol use disorder, severe, dependence (HCC) [F10.20] 11/25/2016  . Substance or medication-induced depressive disorder with onset during intoxication [facility]) [F19.94] 11/25/2016   Subjective Data: Patient with alcohol abuse , presented with depressive sx , SI with plan to shoot. Pt is motivated to get help with his alcohol abuse.   Continued Clinical Symptoms:  Alcohol Use Disorder Identification Test Final Score (AUDIT): 8 The "Alcohol Use Disorders Identification Test", Guidelines for Use in Primary Care, Second Edition.  World Science writer [facility]). Score between 0-7:  no or low risk or alcohol related problems. Score between 8-15:  moderate risk of alcohol related problems. Score between 16-19:  high risk of alcohol related problems. Score 20 or above:  warrants further diagnostic evaluation for alcohol dependence and treatment.   CLINICAL FACTORS:   Depression:   Comorbid alcohol abuse/dependence Impulsivity Insomnia Alcohol/Substance Abuse/Dependencies   Musculoskeletal: Strength & Muscle Tone: within normal limits Gait & Station: normal Patient leans: N/A  Psychiatric Specialty Exam: Physical Exam  Review of Systems  Psychiatric/Behavioral: Positive for depression, substance abuse and suicidal ideas. The patient is nervous/anxious.   All other systems reviewed and are negative.   Blood pressure 95/66, pulse (!) 110, temperature 97.8 F (36.6 C),  temperature source Oral, resp. rate 16, height 5\' 9"  (1.753 m), weight 65.8 kg (145 lb), SpO2 99 %.Body mass index is 21.41 kg/m.  General Appearance: Casual  Eye Contact:  Good  Speech:  Clear and Coherent  Volume:  Normal  Mood:  Anxious, Depressed and Dysphoric  Affect:  Appropriate  Thought Process:  Goal Directed and Descriptions of Associations: Circumstantial  Orientation:  Full (Time, Place, and Person)  Thought Content:  Rumination  Suicidal Thoughts:  Yes.  with intent/plan, shoot , contracts for safety on the unit  Homicidal Thoughts:  No  Memory:  Immediate;   Fair Recent;   Fair Remote;   Fair  Judgement:  Fair  Insight:  Shallow  Psychomotor Activity:  Normal  Concentration:  Concentration: Fair and Attention Span: Fair  Recall:  [employer] of Knowledge:  Fair  Language:  Fair  Akathisia:  No  Handed:  Right  AIMS (if indicated):     Assets:  Communication Skills Desire for Improvement  ADL's:  Intact  Cognition:  WNL  Sleep:  Number of Hours: 6.75      COGNITIVE FEATURES THAT CONTRIBUTE TO RISK:  Closed-mindedness, Polarized thinking and Thought constriction (tunnel vision)    SUICIDE RISK:   Moderate:  Frequent suicidal ideation with limited intensity, and duration, some specificity in terms of plans, no associated intent, good self-control, limited dysphoria/symptomatology, some risk factors present, and identifiable protective factors, including available and accessible social support.  PLAN OF CARE: pls see hp for details.  I certify that inpatient services furnished can reasonably be expected to improve the patient's condition.   Dong Nimmons, MD 11/25/2016, 1:56 PM

## 2016-11-25 NOTE — H&P (Signed)
Psychiatric Admission Assessment Adult  Patient Identification: Ricardo [name] MRN:  [id number] Date of Evaluation:  11/25/2016 Chief Complaint:  MDD  alcohol use disorder  Principal Diagnosis: MDD (major depressive disorder), recurrent episode, severe (HCC) Diagnosis:   Patient Active Problem List   Diagnosis Date Noted  . MDD (major depressive disorder), recurrent episode, severe (HCC) [F33.2] 11/24/2016   History of Present Illness:  11/24/16 [facility] Initial Assessment: 56 y.o. male. Pt was cooperative with assessment counselor an answered questions appropriately. He came into the ED intoxicated with a blood alcohol level of .36. He states that he has been feeling suicidal and had been having thoughts to kill himself in "an unusual way." He states that he has "two kegs of gun powder and he would like to blow himself up so there is nothing left." He states that he is a chronic alcoholic and is drinking 1.5th of liquor a day. He states that he is "worthless and good for nothing." and doesn't want to live anymore. He currently stays with his mom whom he takes care of. He states that she is a trigger for him and is constantly criticizing how he is taking care of her and cleaning the house. Pt denies having ant other family support. He states that he has done AA in the past and it was the "best two years of his life." Pt also goes to [facility] for his medication management and has a therapist there. He states that he doe not feel it is enough support for him right now and feels like he drinks to "make him forget." Pt states he hears voices sometimes but they "don't say anything bad." Hs also endorsees having visual hallucinations from time to time and states the earlier he was trying to pick thing up off the floor but they weren't there. It is unclear whether this is induced by drinking or underlying psychiatric condition. Pt does have a history of depression and anxiety. Patient reports today that he is not sure  about what medications he needs, but he stopped all of his medications about 4 weeks ago and started drinking 1/5 a day. He lived with his mom but he may not be able to return there after this most recent event. He is questioned about seizures and his Valium and he denies any seizure activity except violent shakes during detox in the past. He also states that a lot of his symptoms worsened after more medications were added. Patient is started on Librium protocol, Valium is discontinued, Celexa is discontinued, Gabapentin 300 mg PO TID continued. He denies any SI/HI/AVH at interview and states that he usually feels depressed due to his alcohol abuse and how he treats his mother when he drinks. He is requesting detox and possibly residential as well.   Associated Signs/Symptoms: Depression Symptoms:  depressed mood, feelings of worthlessness/guilt, suicidal thoughts without plan, anxiety, disturbed sleep, (Hypo) Manic Symptoms:  Denies Anxiety Symptoms:  Excessive Worry, Psychotic Symptoms:  Only when going through detox PTSD Symptoms: Negative Total Time spent with patient: 30 minutes  Past Psychiatric History: Polysubstance abuse, MDD, Alcoholism  Is the patient at risk to self? No.  Has the patient been a risk to self in the past 6 months? No.  Has the patient been a risk to self within the distant past? No.  Is the patient a risk to others? No.  Has the patient been a risk to others in the past 6 months? No.  Has the patient been a risk  to others within the distant past? No.   Prior Inpatient Therapy: Prior Inpatient Therapy:  (UTA) Prior Therapy Dates: UTA Prior Therapy Facilty/Provider(s): UTA Reason for Treatment: UTA Prior Outpatient Therapy: Prior Outpatient Therapy: Yes Prior Therapy Dates: [facility] Prior Therapy Facilty/Provider(s): Medication management and counseling  Reason for Treatment: UTA Does patient have an ACCT team?: Unknown Does patient have Intensive In-House  Services?  : Unknown Does patient have [facility] services? : Unknown Does patient have P4CC services?: Unknown  Alcohol Screening: 1. How often do you have a drink containing alcohol?: 2 to 3 times a week 2. How many drinks containing alcohol do you have on a typical day when you are drinking?: 5 or 6 3. How often do you have six or more drinks on one occasion?: Less than monthly Preliminary Score: 3 4. How often during the last year have you found that you were not able to stop drinking once you had started?: Less than monthly 5. How often during the last year have you failed to do what was normally expected from you becasue of drinking?: Less than monthly 6. How often during the last year have you needed a first drink in the morning to get yourself going after a heavy drinking session?: Never 7. How often during the last year have you had a feeling of guilt of remorse after drinking?: Never 8. How often during the last year have you been unable to remember what happened the night before because you had been drinking?: Never 9. Have you or someone else been injured as a result of your drinking?: No 10. Has a relative or friend or a doctor or another health worker been concerned about your drinking or suggested you cut down?: No Alcohol Use Disorder Identification Test Final Score (AUDIT): 8 Brief Intervention: Patient declined brief intervention Substance Abuse History in the last 12 months:  Yes.   Consequences of Substance Abuse: Family Consequences:  reviewed Withdrawal Symptoms:   Nausea Tremors Previous Psychotropic Medications: Yes  Psychological Evaluations: Yes  Past Medical History:  Past Medical History:  Diagnosis Date  . Coronary artery disease    History reviewed. No pertinent surgical history. Family History: History reviewed. No pertinent family history. Family Psychiatric  History: None reported Tobacco Screening: Have you used any form of tobacco in the last 30 days?  (Cigarettes, Smokeless Tobacco, Cigars, and/or Pipes): Yes Tobacco use, Select all that apply: 5 or more cigarettes per day Are you interested in Tobacco Cessation Medications?: Yes, will notify MD for an order Counseled patient on smoking cessation including recognizing danger situations, developing coping skills and basic information about quitting provided: Yes Social History:  History  Alcohol Use  . Yes     History  Drug Use  . Types: Marijuana    Additional Social History: Marital status:  (UTA)    Pain Medications: See MAR  Prescriptions: See MAR Over the Counter: Se MAR History of alcohol / drug use?: Yes Name of Substance 1: Alochol  1 - Age of First Use: UTA 1 - Amount (size/oz): UTA 1 - Frequency: UTA 1 - Duration: UTA 1 - Last Use / Amount: UTA                  Allergies:  No Known Allergies Lab Results: No results found for this or any previous visit (from the past 48 hour(s)).  Blood Alcohol level:  No results found for: [facility]  Metabolic Disorder Labs:  No results found for: HGBA1C, MPG No  results found for: PROLACTIN No results found for: CHOL, TRIG, HDL, CHOLHDL, VLDL, LDLCALC  Current Medications: Current Facility-Administered Medications  Medication Dose Route Frequency Provider Last Rate Last Dose  . acetaminophen (TYLENOL) tablet 650 mg  650 mg Oral Q6H PRN Okonkwo, Justina A, NP      . alum & mag hydroxide-simeth (MAALOX/MYLANTA) 200-200-20 MG/5ML suspension 30 mL  30 mL Oral Q4H PRN Okonkwo, Justina A, NP      . atorvastatin (LIPITOR) tablet 20 mg  20 mg Oral q1800 Okonkwo, Justina A, NP   20 mg at 11/24/16 1905  . chlordiazePOXIDE (LIBRIUM) capsule 25 mg  25 mg Oral Q6H PRN Money, Gerlene Burdock, FNP      . chlordiazePOXIDE (LIBRIUM) capsule 25 mg  25 mg Oral QID Money, Gerlene Burdock, FNP   25 mg at 11/25/16 1201   Followed by  . [START ON 11/26/2016] chlordiazePOXIDE (LIBRIUM) capsule 25 mg  25 mg Oral TID Money, Gerlene Burdock, FNP       Followed by  .  [START ON 11/27/2016] chlordiazePOXIDE (LIBRIUM) capsule 25 mg  25 mg Oral BH-qamhs Money, Gerlene Burdock, FNP       Followed by  . [START ON 11/29/2016] chlordiazePOXIDE (LIBRIUM) capsule 25 mg  25 mg Oral Daily Money, Gerlene Burdock, FNP      . cholecalciferol (VITAMIN D) tablet 1,000 Units  1,000 Units Oral Daily Okonkwo, Justina A, NP   1,000 Units at 11/25/16 1020  . feeding supplement (ENSURE ENLIVE) (ENSURE ENLIVE) liquid 237 mL  237 mL Oral BID BM Cobos, Rockey Situ, MD   237 mL at 11/25/16 1021  . gabapentin (NEURONTIN) capsule 300 mg  300 mg Oral TID Beryle Lathe, Justina A, NP   300 mg at 11/25/16 1201  . hydrOXYzine (ATARAX/VISTARIL) tablet 50 mg  50 mg Oral TID PRN Money, Gerlene Burdock, FNP      . lisinopril (PRINIVIL,ZESTRIL) tablet 20 mg  20 mg Oral Daily Okonkwo, Justina A, NP   20 mg at 11/24/16 1905  . loperamide (IMODIUM) capsule 2-4 mg  2-4 mg Oral PRN Okonkwo, Justina A, NP      . magnesium hydroxide (MILK OF MAGNESIA) suspension 30 mL  30 mL Oral Daily PRN Okonkwo, Justina A, NP      . multivitamin with minerals tablet 1 tablet  1 tablet Oral Daily Money, Travis B, FNP      . nicotine (NICODERM CQ - dosed in mg/24 hours) patch 21 mg  21 mg Transdermal Daily Money, Travis B, FNP   21 mg at 11/25/16 1203  . ondansetron (ZOFRAN-ODT) disintegrating tablet 4 mg  4 mg Oral Q6H PRN Okonkwo, Justina A, NP      . thiamine (VITAMIN B-1) tablet 100 mg  100 mg Oral Daily Okonkwo, Justina A, NP   100 mg at 11/25/16 1020  . traZODone (DESYREL) tablet 200 mg  200 mg Oral QHS Okonkwo, Justina A, NP   200 mg at 11/24/16 2126   PTA Medications: Prescriptions Prior to Admission  Medication Sig Dispense Refill Last Dose  . acetaminophen (TYLENOL) 325 MG tablet Take 650 mg by mouth every 6 (six) hours as needed for mild pain, fever or headache.     . albuterol (PROVENTIL HFA;VENTOLIN HFA) 108 (90 Base) MCG/ACT inhaler Inhale into the lungs every 6 (six) hours as needed for wheezing or shortness of breath.     .  cholecalciferol (VITAMIN D) 1000 units tablet Take 2,000 Units by mouth daily.     Marland Kitchen  doxepin (SINEQUAN) 100 MG capsule Take 200 mg by mouth at bedtime.   Past Week at Unknown time  . lisinopril-hydrochlorothiazide (PRINZIDE,ZESTORETIC) 20-25 MG per tablet Take 1 tablet by mouth daily.   Past Week at Unknown time  . loxapine (LOXITANE) 25 MG capsule Take 25 mg by mouth at bedtime.   Past Week at Unknown time  . meloxicam (MOBIC) 7.5 MG tablet Take 7.5 mg by mouth 2 (two) times daily as needed for pain.   Past Week at Unknown time  . metoprolol succinate (TOPROL-XL) 25 MG 24 hr tablet Take 25 mg by mouth daily.   Past Week at Unknown time  . mirtazapine (REMERON) 15 MG tablet Take 15 mg by mouth at bedtime.   Past Week at Unknown time  . simvastatin (ZOCOR) 80 MG tablet Take 80 mg by mouth daily.   Past Week at Unknown time  . oxyCODONE-acetaminophen (PERCOCET) 5-325 MG per tablet Take 1-2 tablets by mouth every 6 (six) hours as needed for moderate pain. 45 tablet 0     Musculoskeletal: Strength & Muscle Tone: within normal limits Gait & Station: normal Patient leans: N/A  Psychiatric Specialty Exam: Physical Exam  Nursing note and vitals reviewed. Constitutional: He is oriented to person, place, and time. He appears well-developed and well-nourished.  Respiratory: Effort normal.  Musculoskeletal: Normal range of motion.  Neurological: He is alert and oriented to person, place, and time.  Skin: Skin is warm.    Review of Systems  Constitutional: Negative.   HENT: Negative.   Eyes: Negative.   Respiratory: Negative.   Cardiovascular: Negative.   Gastrointestinal: Negative.   Genitourinary: Negative.   Musculoskeletal: Negative.   Skin: Negative.   Neurological: Negative.   Endo/Heme/Allergies: Negative.     Blood pressure 95/66, pulse (!) 110, temperature 97.8 F (36.6 C), temperature source Oral, resp. rate 16, height 5\' 9"  (1.753 m), weight 65.8 kg (145 lb), SpO2 99 %.Body mass  index is 21.41 kg/m.  General Appearance: Disheveled  Eye Contact:  Good  Speech:  Normal Rate  Volume:  Normal  Mood:  Depressed  Affect:  Flat  Thought Process:  Coherent and Descriptions of Associations: Intact  Orientation:  Full (Time, Place, and Person)  Thought Content:  WDL  Suicidal Thoughts:  No  Homicidal Thoughts:  No  Memory:  Immediate;   Good  Judgement:  Fair  Insight:  Good  Psychomotor Activity:  Restlessness and Tremor  Concentration:  Concentration: Good  Recall:  Good  Fund of Knowledge:  Good  Language:  Good  Akathisia:  Negative  Handed:  Right  AIMS (if indicated):     Assets:  Housing Social Support  ADL's:  Intact  Cognition:  WNL  Sleep:  Number of Hours: 6.75    Treatment Plan Summary: Daily contact with patient to assess and evaluate symptoms and progress in treatment, Medication management and Plan is to:  -See above HPI for management  Observation Level/Precautions:  15 minute checks  Laboratory:  Reviewed  Psychotherapy:  Group therapy  Medications:  See [facility]  Consultations:  As needed  Discharge Concerns:  Relapse  Estimated LOS: 3-5 days  Other:  Admit to [address]   Physician Treatment Plan for Primary Diagnosis: MDD (major depressive disorder), recurrent episode, severe (HCC) Long Term Goal(s): Improvement in symptoms so as ready for discharge  Short Term Goals: Ability to verbalize feelings will improve  Physician Treatment Plan for Secondary Diagnosis: Principal Problem:   MDD (major depressive disorder),  recurrent episode, severe (HCC)  Long Term Goal(s): Improvement in symptoms so as ready for discharge  Short Term Goals: Ability to demonstrate self-control will improve and Compliance with prescribed medications will improve  I certify that inpatient services furnished can reasonably be expected to improve the patient's condition.    Maryfrances Bunnell, FNP 8/25/201812:40 PM

## 2016-11-25 NOTE — [facility] Group Notes (Signed)
[facility] LCSW Group Therapy Note  11/25/2016 @ 10:15 to 11:15 AM  Type of Therapy and Topic:  Group Therapy: Avoiding Self-Sabotaging and Enabling Behaviors  Participation Level:  Minimal   Description of Group The main focus of today's process group to discuss what "self-sabotage" means and use motivational iInterviewing to discuss what benefits, negative or positive, were involved in a self-identified self-sabotaging behavior. We then talked about reasons the patient may want to change the behavior and their current desire to change.   Summary of Patient Progress: Patient was in and out of group room at several points and did not appear well as evidenced by body language.  Patient identified with rationalization and rumination yet was basically quiet as this was his first group since admit. .    Therapeutic molalities: Cognitive Behavioral Therapy Person-Centered Therapy Motivational Interviewing  Therapeutic Goals: 1. Patients will demonstrate understanding of the concept of self sabotage 2. Patients will be able to identify pros and cons of their behaviors 3. Patients will be able to identify at least two motivating factors for l of their desire for change   Carney Bern, LCSW

## 2016-11-25 NOTE — Progress Notes (Signed)
D    Pt isolates to his room   He is having some moderate withdrawal symptoms   He endorses some depression and anxiety tremors and sweats  He did not attend group   He reports he just feels bad and all he wants to do is lay in bed  He also asked for extra doses of librium before time to get it because the withdrawal symptoms were so bad A    Verbal support given   Medications administered and effectiveness monitored    Q 15 min checks   Discuss withdrawal symptoms and medications to relieve those symptoms R   Pt verbalized understanding and is safe and receptive to verbal support

## 2016-11-25 NOTE — Progress Notes (Signed)
Patient ID: Ricardo [name] Current, male   DOB: 1960-08-20, 56 y.o.   MRN: [id number]    D: Pt has been very flat and depressed on the unit today, he has also reported lots of withdrawal symptoms. Pt reported that he was very anxious, in pain, and having lots of tremors. Pt was seen by Feliz Beam NP, new orders were noted. Pt reported that his depression was a 7, his hopelessness was a 7, and his anxiety was a 8. Pt reported that his goal for today was to live. Pt reported being negative SI/HI, no AH/VH noted. A: 15 min checks continued for patient safety. R: Pt safety maintained.

## 2016-11-26 DIAGNOSIS — F419 Anxiety disorder, unspecified: Secondary | ICD-10-CM

## 2016-11-26 DIAGNOSIS — F1290 Cannabis use, unspecified, uncomplicated: Secondary | ICD-10-CM

## 2016-11-26 DIAGNOSIS — G4700 Insomnia, unspecified: Secondary | ICD-10-CM

## 2016-11-26 DIAGNOSIS — F1020 Alcohol dependence, uncomplicated: Secondary | ICD-10-CM

## 2016-11-26 DIAGNOSIS — F39 Unspecified mood [affective] disorder: Secondary | ICD-10-CM

## 2016-11-26 DIAGNOSIS — Z811 Family history of alcohol abuse and dependence: Secondary | ICD-10-CM

## 2016-11-26 MED ORDER — POLYETHYLENE GLYCOL 3350 17 G PO PACK
17.0000 g | PACK | Freq: Every day | ORAL | Status: DC | PRN
  Administered 2016-11-26 – 2016-11-29 (×3): 17 g via ORAL
  Filled 2016-11-26 (×3): qty 1

## 2016-11-26 MED ORDER — DOCUSATE SODIUM 100 MG PO CAPS
100.0000 mg | ORAL_CAPSULE | Freq: Two times a day (BID) | ORAL | Status: DC
  Administered 2016-11-26 – 2016-11-30 (×8): 100 mg via ORAL
  Filled 2016-11-26: qty 1
  Filled 2016-11-26: qty 2
  Filled 2016-11-26 (×11): qty 1

## 2016-11-26 NOTE — Progress Notes (Signed)
D: Patient observed sitting in dayroom, some interaction with peers. Patient verbalizes to this Clinical research associate he is struggling with withdrawal - noticeably flushed, sweating, anxious, tremulous and holding abdominal area due to indigestion. Patient's affect anxious, sad with congruent mood. Per self inventory and discussions with writer, rates depression at a 5/10, hopelessness at an 8/10 and anxiety at an 8/10. Rates sleep as poor, appetite as good, energy as low and concentration as poor.  States goal for today is to "feel better about myself." Complaining of chronic back pain of an 8/10.   A: Medicated per orders, prn vistaril, librium, maalox and tylenol given for discomfort, withdrawal. Level III obs in place for safety. Emotional support offered and self inventory reviewed. Encouraged completion of Suicide Safety Plan and programming participation. Discussed POC with NP, SW.    R: Patient verbalizes understanding of POC. On reassess, patient reports little to no relief and NP made aware. Patient denies SI/HI/AVH and remains safe on level III obs. Will continue to monitor closely and make verbal contact frequently.

## 2016-11-26 NOTE — Progress Notes (Signed)
[facility] MD Progress Note  11/26/2016 12:38 PM Ricardo [name]  MRN:  [id number]   Subjective:  Patient states that he is doing better. He still has some withdrawal symptoms, but improved since yesterday and starting his medications. He is requesting residential treatment still. He spoke with his mother and feels that residential treatment will help him remain sober and live with his mother longer.   Objective: Patient is pleasant and cooperative. He does have some tremors seen from withdrawal. He denies any SI/HI/AVH on the unit and contracts for safety on the unit. Denies any medication side effects. Will get patient through detox and then discuss depression treatment, since he is unsure what is exactly going on with him due to his chronic alcoholism. Patient is seen in the day room participating in group and interacting with staff and other patients appropriately.   Principal Problem: Substance or medication-induced depressive disorder with onset during intoxication [facility]) Diagnosis:   Patient Active Problem List   Diagnosis Date Noted  . Alcohol use disorder, severe, dependence (HCC) [F10.20] 11/25/2016  . Substance or medication-induced depressive disorder with onset during intoxication [facility]) [F19.94] 11/25/2016   Total Time spent with patient: 25 minutes  Past Psychiatric History: See H&P  Past Medical History:  Past Medical History:  Diagnosis Date  . Coronary artery disease    History reviewed. No pertinent surgical history. Family History:  Family History  Problem Relation Age of Onset  . Alcoholism Father    Family Psychiatric  History: S3ee H&P Social History:  History  Alcohol Use  . Yes     History  Drug Use  . Types: Marijuana    Social History   Social History  . Marital status: Single    Spouse name: N/A  . Number of children: N/A  . Years of education: N/A   Social History Main Topics  . Smoking status: Current Every Day Smoker    Types: Cigarettes  . Smokeless  tobacco: Never Used  . Alcohol use Yes  . Drug use: Yes    Types: Marijuana  . Sexual activity: Not Asked   Other Topics Concern  . None   Social History Narrative  . None   Additional Social History:    Pain Medications: See MAR  Prescriptions: See MAR Over the Counter: Se MAR History of alcohol / drug use?: Yes Name of Substance 1: Alochol  1 - Age of First Use: UTA 1 - Amount (size/oz): UTA 1 - Frequency: UTA 1 - Duration: UTA 1 - Last Use / Amount: UTA                  Sleep: Fair  Appetite:  Fair  Current Medications: Current Facility-Administered Medications  Medication Dose Route Frequency Provider Last Rate Last Dose  . acetaminophen (TYLENOL) tablet 650 mg  650 mg Oral Q6H PRN Okonkwo, Justina A, NP   650 mg at 11/26/16 0833  . alum & mag hydroxide-simeth (MAALOX/MYLANTA) 200-200-20 MG/5ML suspension 30 mL  30 mL Oral Q4H PRN Okonkwo, Justina A, NP   30 mL at 11/26/16 0949  . atorvastatin (LIPITOR) tablet 20 mg  20 mg Oral q1800 Okonkwo, Justina A, NP   20 mg at 11/25/16 1705  . chlordiazePOXIDE (LIBRIUM) capsule 25 mg  25 mg Oral Q6H PRN Collin Rengel, Gerlene Burdock, FNP   25 mg at 11/26/16 0949  . chlordiazePOXIDE (LIBRIUM) capsule 25 mg  25 mg Oral TID Zalen Sequeira, Gerlene Burdock, FNP   25 mg at 11/26/16 1141  Followed by  . [START ON 11/27/2016] chlordiazePOXIDE (LIBRIUM) capsule 25 mg  25 mg Oral BH-qamhs Maanasa Aderhold, Gerlene Burdock, FNP       Followed by  . [START ON 11/29/2016] chlordiazePOXIDE (LIBRIUM) capsule 25 mg  25 mg Oral Daily Artie Mcintyre, Gerlene Burdock, FNP      . cholecalciferol (VITAMIN D) tablet 1,000 Units  1,000 Units Oral Daily Okonkwo, Justina A, NP   1,000 Units at 11/26/16 0831  . feeding supplement (ENSURE ENLIVE) (ENSURE ENLIVE) liquid 237 mL  237 mL Oral BID BM Cobos, Rockey Situ, MD   237 mL at 11/26/16 0836  . gabapentin (NEURONTIN) capsule 300 mg  300 mg Oral TID Beryle Lathe, Justina A, NP   300 mg at 11/26/16 1140  . hydrOXYzine (ATARAX/VISTARIL) tablet 50 mg  50 mg Oral TID  PRN Aubreigh Fuerte, Gerlene Burdock, FNP   50 mg at 11/26/16 8295  . lisinopril (PRINIVIL,ZESTRIL) tablet 20 mg  20 mg Oral Daily Okonkwo, Justina A, NP   20 mg at 11/26/16 0831  . loperamide (IMODIUM) capsule 2-4 mg  2-4 mg Oral PRN Okonkwo, Justina A, NP      . magnesium hydroxide (MILK OF MAGNESIA) suspension 30 mL  30 mL Oral Daily PRN Okonkwo, Justina A, NP      . multivitamin with minerals tablet 1 tablet  1 tablet Oral Daily Vaughan Garfinkle, Gerlene Burdock, FNP   1 tablet at 11/26/16 0831  . nicotine (NICODERM CQ - dosed in mg/24 hours) patch 21 mg  21 mg Transdermal Daily Sathvik Tiedt, Gerlene Burdock, FNP   21 mg at 11/26/16 0831  . ondansetron (ZOFRAN-ODT) disintegrating tablet 4 mg  4 mg Oral Q6H PRN Okonkwo, Justina A, NP      . thiamine (VITAMIN B-1) tablet 100 mg  100 mg Oral Daily Okonkwo, Justina A, NP   100 mg at 11/26/16 0831  . traZODone (DESYREL) tablet 200 mg  200 mg Oral QHS Okonkwo, Justina A, NP   200 mg at 11/25/16 2121    Lab Results: No results found for this or any previous visit (from the past 48 hour(s)).  Blood Alcohol level:  No results found for: [facility]  Metabolic Disorder Labs: No results found for: HGBA1C, MPG No results found for: PROLACTIN No results found for: CHOL, TRIG, HDL, CHOLHDL, VLDL, LDLCALC  Physical Findings: AIMS: Facial and Oral Movements Muscles of Facial Expression: None, normal Lips and Perioral Area: None, normal Jaw: None, normal Tongue: None, normal,Extremity Movements Upper (arms, wrists, hands, fingers): None, normal Lower (legs, knees, ankles, toes): None, normal, Trunk Movements Neck, shoulders, hips: None, normal, Overall Severity Severity of abnormal movements (highest score from questions above): None, normal Incapacitation due to abnormal movements: None, normal Patient's awareness of abnormal movements (rate only patient's report): No Awareness, Dental Status Current problems with teeth and/or dentures?: No Does patient usually wear dentures?: No  CIWA:  CIWA-Ar  Total: 6 COWS:     Musculoskeletal: Strength & Muscle Tone: within normal limits Gait & Station: normal Patient leans: N/A  Psychiatric Specialty Exam: Physical Exam  Nursing note and vitals reviewed. Constitutional: He is oriented to person, place, and time. He appears well-developed and well-nourished.  Cardiovascular: Normal rate.   Respiratory: Effort normal.  Musculoskeletal: Normal range of motion.  Visible tremors  Neurological: He is oriented to person, place, and time.  Skin: Skin is warm.    Review of Systems  Constitutional: Negative.   HENT: Negative.   Eyes: Negative.   Respiratory: Negative.   Cardiovascular: Negative.  Gastrointestinal: Negative.   Genitourinary: Negative.   Musculoskeletal: Negative.   Skin: Negative.   Neurological: Negative.   Endo/Heme/Allergies: Negative.     Blood pressure 109/72, pulse 85, temperature 97.6 F (36.4 C), temperature source Oral, resp. rate 16, height 5\' 9"  (1.753 m), weight 65.8 kg (145 lb), SpO2 99 %.Body mass index is 21.41 kg/m.  General Appearance: Casual  Eye Contact:  Good  Speech:  Clear and Coherent and Normal Rate  Volume:  Normal  Mood:  Depressed  Affect:  Flat  Thought Process:  Coherent and Descriptions of Associations: Intact  Orientation:  Full (Time, Place, and Person)  Thought Content:  WDL  Suicidal Thoughts:  No  Homicidal Thoughts:  No  Memory:  Immediate;   Fair Recent;   Fair  Judgement:  Good  Insight:  Good  Psychomotor Activity:  Normal  Concentration:  Concentration: Good  Recall:  Good  Fund of Knowledge:  Good  Language:  Good  Akathisia:  No  Handed:  Right  AIMS (if indicated):     Assets:  Desire for Improvement Financial Resources/Insurance Social Support  ADL's:  Intact  Cognition:  WNL  Sleep:  Number of Hours: 6.25     Treatment Plan Summary: Daily contact with patient to assess and evaluate symptoms and progress in treatment, Medication management and Plan is  to:  -Continue Librium CIWA protocol -Continue Hydroxyzine 50 mg PO TID PRN for anxiety -Continue Gabapentin 300 mg PO TID for mood stability -Continue Trazodone 200 mg PO QHS PRN for insomnia -Encourage group therapy participation  Maryfrances Bunnell, FNP 11/26/2016, 12:38 PM

## 2016-11-26 NOTE — Progress Notes (Signed)
NUTRITION ASSESSMENT  Pt identified as at risk on the Malnutrition Screen Tool  INTERVENTION: 1.. Supplements: Continue Ensure Enlive po BID, each supplement provides 350 kcal and 20 grams of protein  NUTRITION DIAGNOSIS: Unintentional weight loss related to sub-optimal intake as evidenced by pt report.   Goal: Pt to meet >/= 90% of their estimated nutrition needs.  Monitor:  PO intake  Assessment:  Pt admitted with substance abuse and ETOH abuse. Pt reports drinking a fifth of liquor a day. Pt with increased need d/t substance abuse. Has been ordered Ensure supplements, will continue. No weight history in chart.   Height: Ht Readings from Last 1 Encounters:  11/24/16 5\' 9"  (1.753 m)    Weight: Wt Readings from Last 1 Encounters:  11/24/16 145 lb (65.8 kg)    Weight Hx: Wt Readings from Last 10 Encounters:  11/24/16 145 lb (65.8 kg)    BMI:  Body mass index is 21.41 kg/m. Pt meets criteria for normal based on current BMI.  Estimated Nutritional Needs: Kcal: 25-30 kcal/kg Protein: > 1 gram protein/kg Fluid: 1 ml/kcal  Diet Order: Diet regular Room service appropriate? Yes; Fluid consistency: Thin Pt is also offered choice of unit snacks mid-morning and mid-afternoon.  Pt is eating as desired.   Lab results and medications reviewed.   Tilda Franco, MS, RD, LDN Pager: [phone] After Hours Pager: [phone]

## 2016-11-26 NOTE — Plan of Care (Signed)
Problem: Medication: Goal: Compliance with prescribed medication regimen will improve Outcome: Completed/Met Date Met: 11/26/16 Patient is med compliant.  Problem: Safety: Goal: Periods of time without injury will increase Outcome: Completed/Met Date Met: 11/26/16 Patient has not engaged in self harm, denies thoughts to do so. Denies SI as well.

## 2016-11-26 NOTE — [facility] Group Notes (Signed)
[facility] LCSW Group Therapy Note   11/26/2016  10:15 - 11:15 AM   Type of Therapy and Topic: Group Therapy: Feelings Around Returning Home & Establishing a Framework Self Care   Participation Level:  Active   Description of Group:  Patients first processed thoughts and feelings about up coming discharge. These included fears of upcoming changes, lack of change, new living environments, judgements and expectations from others and overall stigma of MH issues. We discussed where to find supports and what self care may look and feel like.  Therapeutic Goals Addressed in Processing Group:  1. Patient will identify one healthy supportive that they can use at discharge. 2. Patient will identify one factor of a supportive framework and how to tell it from an unhealthy network. 3. Patient able to identify one coping skill to use when they do not have positive supports from others. 4. Patient will demonstrate ability to communicate their needs through discussion and/or role plays.  Summary of Patient Progress:  Pt engaged more easily during group session today. As patients processed their anxiety about discharge and described healthy supports patient shared little yet engaged easily on topic of self care. Patient processed his difficulty imp[lementing self care in the face of his shame and guilt.   Carney Bern, LCSW

## 2016-11-26 NOTE — Progress Notes (Signed)
D    Pt isolates to his room   He is having some moderate withdrawal symptoms   He endorses some depression and anxiety tremors and sweats  He did  attend group  Pt appears to have some improvement in his withdrawal symptoms A    Verbal support given   Medications administered and effectiveness monitored    Q 15 min checks   Discuss withdrawal symptoms and medications to relieve those symptoms R   Pt verbalized understanding and is safe and receptive to verbal support

## 2016-11-26 NOTE — [facility] Group Notes (Signed)
[facility] Group Notes:  (Nursing/MHT/Case Management/Adjunct)  Date:  11/26/2016  Time:  1430  Type of Therapy:  Nurse Education - Healthy Support Systems  Participation Level:  Active  Participation Quality:  Attentive  Affect:  Anxious  Cognitive:  Alert  Insight:  Good  Engagement in Group:  Engaged  Modes of Intervention:  Education  Summary of Progress/Problems: patient attended and was attentive in discussion regarding healthy support.  Lawrence Marseilles 11/26/2016, 3:17 PM

## 2016-11-26 NOTE — Progress Notes (Signed)
Patient did attend the evening speaker AA meeting.  

## 2016-11-27 DIAGNOSIS — R451 Restlessness and agitation: Secondary | ICD-10-CM

## 2016-11-27 MED ORDER — FLUOXETINE HCL 10 MG PO CAPS
10.0000 mg | ORAL_CAPSULE | Freq: Every day | ORAL | Status: DC
  Administered 2016-11-27 – 2016-11-30 (×4): 10 mg via ORAL
  Filled 2016-11-27 (×6): qty 1

## 2016-11-27 NOTE — [facility] Group Notes (Signed)
[facility] LCSW Group Therapy  11/27/2016 1:24 PM  Type of Therapy:  Group Therapy  Participation Level:  Did Not Attend-pt invited. Chose to remain in bed.   Summary of Progress/Problems: MHA Speaker came to talk about his personal journey with mental illness. The pt processed ways by which to relate to the speaker. MHA speaker provided handouts and educational information pertaining to groups and services offered by the [facility].   Tareek Sabo N Smart LCSW 11/27/2016, 1:24 PM

## 2016-11-27 NOTE — [facility] Suicide Risk Assessment (Signed)
[facility] INPATIENT:  Family/Significant Other Suicide Prevention Education  Suicide Prevention Education:  Patient Refusal for Family/Significant Other Suicide Prevention Education: The patient Ricardo [name] has refused to provide written consent for family/significant other to be provided Family/Significant Other Suicide Prevention Education during admission and/or prior to discharge.  Physician notified.  SPE completed with pt, as pt refused to consent to family contact. SPI pamphlet provided to pt and pt was encouraged to share information with support network, ask questions, and talk about any concerns relating to SPE. Pt denies access to guns/firearms and verbalized understanding of information provided. Mobile Crisis information also provided to pt.   Laporche Martelle N Smart LCSW 11/27/2016, 9:56 AM

## 2016-11-27 NOTE — Progress Notes (Signed)
[facility] MD Progress Note  11/27/2016 12:57 PM Ricardo Ricardo [name]  MRN:  [id number]   Subjective: Ricardo Ricardo [name] reports, "My mind is still foggy & I'm feeling very jittery today. I was hoping that I will go to [facility] after discharge from this hospital, but [facility] declined me today citing my sleep Apnea problems. I have to be positive & hope to get into the [facility]. l feel very depressed. I have heard about Prozac being a good antidepressant. I'm wondering if it can help me"    Objective: Ricardo Ricardo [name] is seen, chart reviewed. He is alert, oriented & aware of situation. He is visible on the unit, participating in the group sessions. He is cooperative. He still does have some tremors from the alcohol withdrawal. He denies any SI/HI/AVH & contracts for safety on the unit. However, he says from time to time, he will see the floor wavering, that this happens to him when he is coming off alcohol. Denies any medication side effects. Has started [place] on depression treatment as he reports worsening depression. He is participating in the group sessions & interacting with staff and other patients appropriately. He does not appear to be responding to any internal stimuli.  Principal Problem: Substance or medication-induced depressive disorder with onset during intoxication [facility]) Diagnosis:   Patient Active Problem List   Diagnosis Date Noted  . Alcohol use disorder, severe, dependence (HCC) [F10.20] 11/25/2016  . Substance or medication-induced depressive disorder with onset during intoxication [facility]) [F19.94] 11/25/2016   Total Time spent with patient: 15 minutes  Past Psychiatric History: Alcohol use disorder, Substance induced mood disorder.  Past Medical History:  Past Medical History:  Diagnosis Date  . Coronary artery disease    History reviewed. No pertinent surgical history. Family History:  Family History  Problem Relation Age of Onset  . Alcoholism Father    Family Psychiatric  History: See H&P  Social History:   History  Alcohol Use  . Yes     History  Drug Use  . Types: Marijuana    Social History   Social History  . Marital status: Single    Spouse name: N/A  . Number of children: N/A  . Years of education: N/A   Social History Main Topics  . Smoking status: Ricardo [name] Every Day Smoker    Types: Cigarettes  . Smokeless tobacco: Never Used  . Alcohol use Yes  . Drug use: Yes    Types: Marijuana  . Sexual activity: Not Asked   Other Topics Concern  . None   Social History Narrative  . None   Additional Social History:    Pain Medications: See MAR  Prescriptions: See MAR Over the Counter: Se MAR History of alcohol / drug use?: Yes Name of Substance 1: Alochol  1 - Age of First Use: UTA 1 - Amount (size/oz): UTA 1 - Frequency: UTA 1 - Duration: UTA 1 - Last Use / Amount: UTA  Sleep: Fair  Appetite:  Fair  Ricardo [name] Medications: Ricardo [name] Facility-Administered Medications  Medication Dose Route Frequency Provider Last Rate Last Dose  . acetaminophen (TYLENOL) tablet 650 mg  650 mg Oral Q6H PRN Okonkwo, Justina A, NP   650 mg at 11/26/16 2204  . alum & mag hydroxide-simeth (MAALOX/MYLANTA) 200-200-20 MG/5ML suspension 30 mL  30 mL Oral Q4H PRN Okonkwo, Justina A, NP   30 mL at 11/26/16 0949  . atorvastatin (LIPITOR) tablet 20 mg  20 mg Oral q1800 Okonkwo, Justina A, NP   20 mg at  11/26/16 1616  . chlordiazePOXIDE (LIBRIUM) capsule 25 mg  25 mg Oral Q6H PRN Ricardo [name], Gerlene Burdock, FNP   25 mg at 11/27/16 1211  . chlordiazePOXIDE (LIBRIUM) capsule 25 mg  25 mg Oral BH-qamhs Ricardo [name], Gerlene Burdock, FNP       Followed by  . [START ON 11/29/2016] chlordiazePOXIDE (LIBRIUM) capsule 25 mg  25 mg Oral Daily Ricardo [name], Gerlene Burdock, FNP      . cholecalciferol (VITAMIN D) tablet 1,000 Units  1,000 Units Oral Daily Okonkwo, Justina A, NP   1,000 Units at 11/27/16 0733  . docusate sodium (COLACE) capsule 100 mg  100 mg Oral BID Ricardo [name], Feliz Beam B, FNP   100 mg at 11/27/16 0732  . feeding supplement (ENSURE  ENLIVE) (ENSURE ENLIVE) liquid 237 mL  237 mL Oral BID BM Lavontay Kirk, Rockey Situ, MD   237 mL at 11/27/16 0959  . FLUoxetine (PROZAC) capsule 10 mg  10 mg Oral Daily Nwoko, Agnes I, NP      . gabapentin (NEURONTIN) capsule 300 mg  300 mg Oral TID Beryle Lathe, Justina A, NP   300 mg at 11/27/16 1200  . hydrOXYzine (ATARAX/VISTARIL) tablet 50 mg  50 mg Oral TID PRN Ricardo [name], Gerlene Burdock, FNP   50 mg at 11/27/16 0959  . lisinopril (PRINIVIL,ZESTRIL) tablet 20 mg  20 mg Oral Daily Okonkwo, Justina A, NP   20 mg at 11/27/16 0733  . loperamide (IMODIUM) capsule 2-4 mg  2-4 mg Oral PRN Okonkwo, Justina A, NP      . magnesium hydroxide (MILK OF MAGNESIA) suspension 30 mL  30 mL Oral Daily PRN Okonkwo, Justina A, NP   30 mL at 11/26/16 1413  . multivitamin with minerals tablet 1 tablet  1 tablet Oral Daily Ricardo [name], Gerlene Burdock, FNP   1 tablet at 11/27/16 0732  . nicotine (NICODERM CQ - dosed in mg/24 hours) patch 21 mg  21 mg Transdermal Daily Ricardo [name], Gerlene Burdock, FNP   21 mg at 11/27/16 0732  . ondansetron (ZOFRAN-ODT) disintegrating tablet 4 mg  4 mg Oral Q6H PRN Okonkwo, Justina A, NP      . polyethylene glycol (MIRALAX / GLYCOLAX) packet 17 g  17 g Oral Daily PRN Ricardo [name], Gerlene Burdock, FNP   17 g at 11/26/16 1634  . thiamine (VITAMIN B-1) tablet 100 mg  100 mg Oral Daily Okonkwo, Justina A, NP   100 mg at 11/27/16 0733  . traZODone (DESYREL) tablet 200 mg  200 mg Oral QHS Okonkwo, Justina A, NP   200 mg at 11/26/16 2205   Lab Results: No results found for this or any previous visit (from the past 48 hour(s)).  Blood Alcohol level:  No results found for: [facility]  Metabolic Disorder Labs: No results found for: HGBA1C, MPG No results found for: PROLACTIN No results found for: CHOL, TRIG, HDL, CHOLHDL, VLDL, LDLCALC  Physical Findings: AIMS: Facial and Oral Movements Muscles of Facial Expression: None, normal Lips and Perioral Area: None, normal Jaw: None, normal Tongue: None, normal,Extremity Movements Upper (arms, wrists,  hands, fingers): None, normal Lower (legs, knees, ankles, toes): None, normal, Trunk Movements Neck, shoulders, hips: None, normal, Overall Severity Severity of abnormal movements (highest score from questions above): None, normal Incapacitation due to abnormal movements: None, normal Patient's awareness of abnormal movements (rate only patient's report): No Awareness, Dental Status Ricardo [name] problems with teeth and/or dentures?: No Does patient usually wear dentures?: No  CIWA:  CIWA-Ar Total: 14 COWS:     Musculoskeletal: Strength & Muscle Tone:  within normal limits Gait & Station: normal Patient leans: N/A  Psychiatric Specialty Exam: Physical Exam  Nursing note and vitals reviewed. Constitutional: He is oriented to person, place, and time. He appears well-developed and well-nourished.  Cardiovascular: Normal rate.   Respiratory: Effort normal.  Musculoskeletal: Normal range of motion.  Visible tremors  Neurological: He is oriented to person, place, and time.  Skin: Skin is warm.    Review of Systems  Constitutional: Negative.   HENT: Negative.   Eyes: Negative.   Respiratory: Negative.   Cardiovascular: Negative.   Gastrointestinal: Negative.   Genitourinary: Negative.   Musculoskeletal: Negative.   Skin: Negative.   Neurological: Negative.   Endo/Heme/Allergies: Negative.   Psychiatric/Behavioral: Positive for depression ("I'm feeling pretty depressed".) and substance abuse (Hx, alcoholism, chronic.). Negative for hallucinations, memory loss and suicidal ideas. The patient has insomnia ("Improving"). The patient is not nervous/anxious.     Blood pressure 100/72, pulse 86, temperature 97.6 F (36.4 C), temperature source Oral, resp. rate 16, height 5\' 9"  (1.753 m), weight 65.8 kg (145 lb), SpO2 99 %.Body mass index is 21.41 kg/m.  General Appearance: Casual  Eye Contact: Good  Speech:  Clear and Coherent and Normal Rate  Volume:  Normal  Mood:  Depressed, Started on  Prozac 10 mg today.  Affect:  Flat  Thought Process:  Coherent and Descriptions of Associations: Intact  Orientation:  Full (Time, Place, and Person)  Thought Content:  WDL and Denies any hallucinations, delusions or paranoia. Says he feels sometimes like the floor is wavering. This happens to him when comimg off of alcohol.   Suicidal Thoughts:  Denies any thoughts, plans or intent.  Homicidal Thoughts:  Denies  Memory:  Immediate;   Good Recent;   Fair Remote;   Fair  Judgement:  Fair  Insight:  Good and Present  Psychomotor Activity:  Tremor  Concentration:  Concentration: Good  Recall:  Good  Fund of Knowledge:  Good  Language:  Good  Akathisia:  No  Handed:  Right  AIMS (if indicated):     Assets:  Desire for Improvement Financial Resources/Insurance Social Support  ADL's:  Intact  Cognition:  WNL  Sleep:  Number of Hours: 6.5   Treatment Plan Summary: Daily contact with patient to assess and evaluate symptoms and progress in treatment, Medication management and Plan is to:   Will continue today 11/27/16 plan as below except where it is noted. -Continue the CIWA protocol for alcohol detox. -Continue Hydroxyzine 50 mg PO TID PRN for anxiety -Continue Gabapentin 300 mg PO TID for agitation/susbtance withdrawal symptoms. -Continue Trazodone 200 mg PO QHS PRN for insomnia -Encourage group therapy participation. -Initiated Prozac 10 mg daily for depression.  Sanjuana Kava, NP, PMHNP, FNP-BC. 11/27/2016, 12:57 PMPatient ID: Ricardo Ricardo [name], male   DOB: 1960/12/24, 56 y.o.   MRN: [id number] Agree with NP Progress Note

## 2016-11-27 NOTE — Progress Notes (Signed)
Recreation Therapy Notes  Date: 11/27/16 Time: 0915 Location: 300 Hall Group Room  Group Topic: Stress Management  Goal Area(s) Addresses:  Patient will verbalize importance of using healthy stress management.  Patient will identify positive emotions associated with healthy stress management.   Behavioral Response: Engaged  Intervention: Stress Management  Activity :  Body Scan Meditation.  LRT introduced the stress management technique of meditation.  LRT played a meditation from the Calm app to allow patients to take inventory of any tension, calmness or other sensations they may have been feeling.  Patients were to follow along as the meditation played.  Education:  Stress Management, Discharge Planning.   Education Outcome: Acknowledges edcuation/In group clarification offered/Needs additional education  Clinical Observations/Feedback: Pt attended group.   Caroll Rancher, LRT/CTRS        Lillia Abed, [facility] A 11/27/2016 11:30 AM

## 2016-11-27 NOTE — Progress Notes (Signed)
Pt has screening for [facility] by phone at 10:30AM. (possible bed availability on Tuesday 11/28/16).  Trula Slade, MSW, LCSW Clinical Social Worker 11/27/2016 10:27 AM

## 2016-11-27 NOTE — Progress Notes (Signed)
Pt declined at [facility] due to sleep apnea diagnosis and use of CPAP machine.  Trula Slade, MSW, LCSW Clinical Social Worker 11/27/2016 11:03 AM

## 2016-11-27 NOTE — Tx Team (Signed)
Interdisciplinary Treatment and Diagnostic Plan Update  11/27/2016 Time of Session: 0830AM Ricardo [name] MRN: [id number]  Principal Diagnosis: Substance or medication-induced depressive disorder with onset during intoxication [facility])  Secondary Diagnoses: Principal Problem:   Substance or medication-induced depressive disorder with onset during intoxication [facility]) Active Problems:   Alcohol use disorder, severe, dependence (HCC)   Current Medications:  Current Facility-Administered Medications  Medication Dose Route Frequency Provider Last Rate Last Dose  . acetaminophen (TYLENOL) tablet 650 mg  650 mg Oral Q6H PRN Okonkwo, Justina A, NP   650 mg at 11/26/16 2204  . alum & mag hydroxide-simeth (MAALOX/MYLANTA) 200-200-20 MG/5ML suspension 30 mL  30 mL Oral Q4H PRN Okonkwo, Justina A, NP   30 mL at 11/26/16 0949  . atorvastatin (LIPITOR) tablet 20 mg  20 mg Oral q1800 Okonkwo, Justina A, NP   20 mg at 11/26/16 1616  . chlordiazePOXIDE (LIBRIUM) capsule 25 mg  25 mg Oral Q6H PRN Money, Gerlene Burdock, FNP   25 mg at 11/26/16 2205  . chlordiazePOXIDE (LIBRIUM) capsule 25 mg  25 mg Oral BH-qamhs Money, Gerlene Burdock, FNP       Followed by  . [START ON 11/29/2016] chlordiazePOXIDE (LIBRIUM) capsule 25 mg  25 mg Oral Daily Money, Gerlene Burdock, FNP      . cholecalciferol (VITAMIN D) tablet 1,000 Units  1,000 Units Oral Daily Okonkwo, Justina A, NP   1,000 Units at 11/27/16 0733  . docusate sodium (COLACE) capsule 100 mg  100 mg Oral BID Money, Feliz Beam B, FNP   100 mg at 11/27/16 0732  . feeding supplement (ENSURE ENLIVE) (ENSURE ENLIVE) liquid 237 mL  237 mL Oral BID BM Cobos, Rockey Situ, MD   237 mL at 11/26/16 1600  . gabapentin (NEURONTIN) capsule 300 mg  300 mg Oral TID Beryle Lathe, Justina A, NP   300 mg at 11/27/16 0733  . hydrOXYzine (ATARAX/VISTARIL) tablet 50 mg  50 mg Oral TID PRN Money, Gerlene Burdock, FNP   50 mg at 11/26/16 2205  . lisinopril (PRINIVIL,ZESTRIL) tablet 20 mg  20 mg Oral Daily Okonkwo, Justina A, NP    20 mg at 11/27/16 0733  . loperamide (IMODIUM) capsule 2-4 mg  2-4 mg Oral PRN Okonkwo, Justina A, NP      . magnesium hydroxide (MILK OF MAGNESIA) suspension 30 mL  30 mL Oral Daily PRN Okonkwo, Justina A, NP   30 mL at 11/26/16 1413  . multivitamin with minerals tablet 1 tablet  1 tablet Oral Daily Money, Gerlene Burdock, FNP   1 tablet at 11/27/16 0732  . nicotine (NICODERM CQ - dosed in mg/24 hours) patch 21 mg  21 mg Transdermal Daily Money, Gerlene Burdock, FNP   21 mg at 11/27/16 0732  . ondansetron (ZOFRAN-ODT) disintegrating tablet 4 mg  4 mg Oral Q6H PRN Okonkwo, Justina A, NP      . polyethylene glycol (MIRALAX / GLYCOLAX) packet 17 g  17 g Oral Daily PRN Money, Gerlene Burdock, FNP   17 g at 11/26/16 1634  . thiamine (VITAMIN B-1) tablet 100 mg  100 mg Oral Daily Okonkwo, Justina A, NP   100 mg at 11/27/16 0733  . traZODone (DESYREL) tablet 200 mg  200 mg Oral QHS Okonkwo, Justina A, NP   200 mg at 11/26/16 2205   PTA Medications: Prescriptions Prior to Admission  Medication Sig Dispense Refill Last Dose  . acetaminophen (TYLENOL) 325 MG tablet Take 650 mg by mouth every 6 (six) hours as needed for mild  pain, fever or headache.     . albuterol (PROVENTIL HFA;VENTOLIN HFA) 108 (90 Base) MCG/ACT inhaler Inhale into the lungs every 6 (six) hours as needed for wheezing or shortness of breath.     Marland Kitchen atorvastatin (LIPITOR) 20 MG tablet TAKE 1 TABLET BY MOUTH DAILY     . cholecalciferol (VITAMIN D) 1000 units tablet Take 2,000 Units by mouth daily.     . diazepam (VALIUM) 2 MG tablet Take 2 mg by mouth.     . doxepin (SINEQUAN) 100 MG capsule Take 200 mg by mouth at bedtime.   Past Week at Unknown time  . esomeprazole (NEXIUM) 40 MG capsule TK 1 C PO QD     . gabapentin (NEURONTIN) 300 MG capsule TK 1 C PO TID     . hydrOXYzine (VISTARIL) 25 MG capsule Take 25 mg by mouth.     Marland Kitchen lisinopril (PRINIVIL,ZESTRIL) 20 MG tablet TAKE 1 TABLET BY MOUTH EVERY DAY     . lisinopril-hydrochlorothiazide  (PRINZIDE,ZESTORETIC) 20-25 MG per tablet Take 1 tablet by mouth daily.   Past Week at Unknown time  . loxapine (LOXITANE) 25 MG capsule Take 25 mg by mouth at bedtime.   Past Week at Unknown time  . meloxicam (MOBIC) 7.5 MG tablet Take 7.5 mg by mouth 2 (two) times daily as needed for pain.   Past Week at Unknown time  . metoprolol succinate (TOPROL-XL) 25 MG 24 hr tablet Take 25 mg by mouth daily.   Past Week at Unknown time  . metoprolol succinate (TOPROL-XL) 25 MG 24 hr tablet TAKE 1 TABLET BY MOUTH EVERY DAY     . mirtazapine (REMERON) 15 MG tablet Take 15 mg by mouth at bedtime.   Past Week at Unknown time  . QUEtiapine (SEROQUEL) 300 MG tablet Take 300 mg by mouth.     . simvastatin (ZOCOR) 80 MG tablet Take 80 mg by mouth daily.   Past Week at Unknown time  . traZODone (DESYREL) 100 MG tablet Take 100 mg by mouth.     Marland Kitchen albuterol (PROVENTIL) (2.5 MG/3ML) 0.083% nebulizer solution 2.5 mg.     . aspirin EC 81 MG tablet Take 81 mg by mouth.     . loratadine (CLARITIN) 10 MG tablet Take 10 mg by mouth.     . OLANZapine (ZYPREXA) 15 MG tablet Take 15 mg by mouth.     . oxyCODONE-acetaminophen (PERCOCET) 5-325 MG per tablet Take 1-2 tablets by mouth every 6 (six) hours as needed for moderate pain. 45 tablet 0   . pantoprazole (PROTONIX) 40 MG tablet Take 40 mg by mouth.       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  Therapeutic Interventions: 1 to 1 sessions, Unit Group sessions and Medication administration.  Evaluation of Outcomes: Progressing  Physician Treatment Plan for Secondary Diagnosis: Principal Problem:   Substance or  medication-induced depressive disorder with onset during intoxication (HCC) Active Problems:   Alcohol use disorder, severe, dependence (HCC)  Long Term Goal(s): Improvement in symptoms so as ready for discharge Improvement in symptoms so as ready for discharge   Short Term Goals: Ability to verbalize feelings will improve Ability to demonstrate self-control will improve Compliance with prescribed medications will improve     Medication Management: Evaluate patient's response, side effects, and tolerance of medication regimen.  Therapeutic Interventions: 1 to 1 sessions, Unit Group sessions and Medication administration.  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 RN will report any adverse and/or side effects to prescribing provider.  Therapeutic Interventions: 1 on 1 counseling  sessions, Psychoeducation, Medication administration, Evaluate responses to treatment, Monitor vital signs and CBGs as ordered, Perform/monitor CIWA, COWS, AIMS and Fall Risk screenings as ordered, Perform wound care treatments as ordered.  Evaluation of Outcomes: Progressing   LCSW Treatment Plan for Primary Diagnosis: Substance or medication-induced depressive disorder with onset during intoxication [facility]) Long Term Goal(s): Safe transition to appropriate next level of care at discharge, Engage patient in therapeutic group addressing interpersonal concerns.  Short Term Goals: Engage patient in aftercare planning with referrals and resources, Facilitate patient progression through stages of change regarding substance use diagnoses and concerns and Increase skills for wellness and recovery  Therapeutic Interventions: Assess for all discharge needs, 1 to 1 time with Social worker, Explore available resources and support systems, Assess for adequacy in community support network, Educate family and significant other(s) on suicide prevention, Complete Psychosocial Assessment, Interpersonal group therapy.  Evaluation of Outcomes: Progressing   Progress in Treatment: Attending groups: Yes. Participating in groups: Yes. Taking medication as prescribed: Yes. Toleration medication: Yes. Family/Significant other contact made: SPE completed with pt; pt declined to consent to family contact.  Patient understands diagnosis: Yes. Discussing patient identified problems/goals with staff: Yes. Medical problems stabilized or resolved: Yes. Denies suicidal/homicidal ideation: Yes. Issues/concerns per patient self-inventory: No. Other: n/a  New problem(s) identified: No, Describe:  n/a  New Short Term/Long Term Goal(s): medication management for detox/mood stabilization; elimination of SI thougths; development of comprehensive mental wellness/sobriety plan.   Patient Goal: "to stop feeling so depressed  and to get into a treatment center."   Discharge Plan or Barriers: CSW assessing. Referral made to [facility] per patient request. Emelia Loron for outpatient services. Currently on [facility] waitlist.   Reason for Continuation of Hospitalization: Anxiety Depression Medication stabilization Withdrawal symptoms  Estimated Length of Stay: Wed 11/29/16  Attendees: Patient: 11/27/2016 9:58 AM  Physician: Dr. Jama Flavors MD 11/27/2016 9:58 AM  Nursing: Roxan Diesel RN 11/27/2016 9:58 AM  RN Care Manager: Onnie Boer CM 11/27/2016 9:58 AM  Social Worker: Trula Slade, LCSW 11/27/2016 9:58 AM  Recreational Therapist: x 11/27/2016 9:58 AM  Other: Armandina Stammer NP; Hillery Jacks NP 11/27/2016 9:58 AM  Other:  11/27/2016 9:58 AM  Other: 11/27/2016 9:58 AM    Scribe for Treatment Team: Ledell Peoples Smart, LCSW 11/27/2016  9:58 AM

## 2016-11-27 NOTE — Progress Notes (Signed)
Patient attended AA group meeting.  

## 2016-11-27 NOTE — Progress Notes (Signed)
Patient denies SI, HI, and AVH this shift.   Patient reported an increase in withdrawal symptoms this shift and was noted to be very tremulous and and anxious.  Patient has attended groups and been compliant with medications.  Assess patient for safety, offer medications as prescribed, engage patient in 1:1 staff talks.   Patient able to contract for safety.  Continue to monitor as planned.  

## 2016-11-28 MED ORDER — CYCLOBENZAPRINE HCL 5 MG PO TABS
5.0000 mg | ORAL_TABLET | Freq: Once | ORAL | Status: AC
  Administered 2016-11-28: 5 mg via ORAL
  Filled 2016-11-28 (×2): qty 1

## 2016-11-28 NOTE — Progress Notes (Signed)
[facility] MD Progress Note  11/28/2016 6:54 PM MICHAELANGELO ECCHER  MRN:  [id number] Subjective:   56 yo Caucasian male, single. History of alcohol use disorder. Presented intoxicated with alcohol. BAL was 360 mg/dl. Expressed thoughts of suicide. Has access to gun powder and thought about blowing self up with it.   Chart reviewed today. Patient discussed at team  Staff reports that he has been appropriate. No behavioral issues. He has not voiced any suicidal thoughts.   Seen today. Says he is feeling better. Shakes are less. No abnormal perception. No feeling of persecution. Reflecting more on how his addiction has impacted his family. Pleased that his family wants him to get better. Still has some negative ruminations. Says he is no longer having any suicidal thoughts. No violent thoughts. He is open to measures that would help him stay sober. Says he has been in rehab over six times. He is connected to his local AA.   Principal Problem: Substance or medication-induced depressive disorder with onset during intoxication [facility]) Diagnosis:   Patient Active Problem List   Diagnosis Date Noted  . Alcohol use disorder, severe, dependence (HCC) [F10.20] 11/25/2016  . Substance or medication-induced depressive disorder with onset during intoxication [facility]) [F19.94] 11/25/2016   Total Time spent with patient: 20 minutes  Past Psychiatric History: As in H&P  Past Medical History:  Past Medical History:  Diagnosis Date  . Coronary artery disease    History reviewed. No pertinent surgical history. Family History:  Family History  Problem Relation Age of Onset  . Alcoholism Father    Family Psychiatric  History: As in H&P Social History:  History  Alcohol Use  . Yes     History  Drug Use  . Types: Marijuana    Social History   Social History  . Marital status: Single    Spouse name: N/A  . Number of children: N/A  . Years of education: N/A   Social History Main Topics  . Smoking status:  Current Every Day Smoker    Types: Cigarettes  . Smokeless tobacco: Never Used  . Alcohol use Yes  . Drug use: Yes    Types: Marijuana  . Sexual activity: Not Asked   Other Topics Concern  . None   Social History Narrative  . None   Additional Social History:    Pain Medications: See MAR  Prescriptions: See MAR Over the Counter: Se MAR History of alcohol / drug use?: Yes Name of Substance 1: Alochol  1 - Age of First Use: UTA 1 - Amount (size/oz): UTA 1 - Frequency: UTA 1 - Duration: UTA 1 - Last Use / Amount: UTA       Sleep: Good  Appetite:  Good  Current Medications: Current Facility-Administered Medications  Medication Dose Route Frequency Provider Last Rate Last Dose  . acetaminophen (TYLENOL) tablet 650 mg  650 mg Oral Q6H PRN Okonkwo, Justina A, NP   650 mg at 11/26/16 2204  . alum & mag hydroxide-simeth (MAALOX/MYLANTA) 200-200-20 MG/5ML suspension 30 mL  30 mL Oral Q4H PRN Okonkwo, Justina A, NP   30 mL at 11/28/16 0144  . atorvastatin (LIPITOR) tablet 20 mg  20 mg Oral q1800 Okonkwo, Justina A, NP   20 mg at 11/28/16 1850  . [START ON 11/29/2016] chlordiazePOXIDE (LIBRIUM) capsule 25 mg  25 mg Oral Daily Money, Gerlene Burdock, FNP      . cholecalciferol (VITAMIN D) tablet 1,000 Units  1,000 Units Oral Daily Beryle Lathe, Justina A, NP  1,000 Units at 11/28/16 0732  . docusate sodium (COLACE) capsule 100 mg  100 mg Oral BID Money, Feliz Beam B, FNP   100 mg at 11/28/16 1649  . feeding supplement (ENSURE ENLIVE) (ENSURE ENLIVE) liquid 237 mL  237 mL Oral BID BM Cobos, Rockey Situ, MD   237 mL at 11/27/16 1443  . FLUoxetine (PROZAC) capsule 10 mg  10 mg Oral Daily Armandina Stammer I, NP   10 mg at 11/28/16 0731  . gabapentin (NEURONTIN) capsule 300 mg  300 mg Oral TID Beryle Lathe, Justina A, NP   300 mg at 11/28/16 1649  . hydrOXYzine (ATARAX/VISTARIL) tablet 50 mg  50 mg Oral TID PRN Money, Gerlene Burdock, FNP   50 mg at 11/28/16 1341  . lisinopril (PRINIVIL,ZESTRIL) tablet 20 mg  20 mg Oral  Daily Okonkwo, Justina A, NP   20 mg at 11/28/16 0731  . magnesium hydroxide (MILK OF MAGNESIA) suspension 30 mL  30 mL Oral Daily PRN Okonkwo, Justina A, NP   30 mL at 11/28/16 0732  . multivitamin with minerals tablet 1 tablet  1 tablet Oral Daily Money, Gerlene Burdock, FNP   1 tablet at 11/28/16 0732  . nicotine (NICODERM CQ - dosed in mg/24 hours) patch 21 mg  21 mg Transdermal Daily Money, Gerlene Burdock, FNP   21 mg at 11/28/16 0731  . polyethylene glycol (MIRALAX / GLYCOLAX) packet 17 g  17 g Oral Daily PRN Money, Gerlene Burdock, FNP   17 g at 11/26/16 1634  . thiamine (VITAMIN B-1) tablet 100 mg  100 mg Oral Daily Okonkwo, Justina A, NP   100 mg at 11/28/16 0731  . traZODone (DESYREL) tablet 200 mg  200 mg Oral QHS Okonkwo, Justina A, NP   200 mg at 11/27/16 2108    Lab Results: No results found for this or any previous visit (from the past 48 hour(s)).  Blood Alcohol level:  No results found for: [facility]  Metabolic Disorder Labs: No results found for: HGBA1C, MPG No results found for: PROLACTIN No results found for: CHOL, TRIG, HDL, CHOLHDL, VLDL, LDLCALC  Physical Findings: AIMS: Facial and Oral Movements Muscles of Facial Expression: None, normal Lips and Perioral Area: None, normal Jaw: None, normal Tongue: None, normal,Extremity Movements Upper (arms, wrists, hands, fingers): None, normal Lower (legs, knees, ankles, toes): None, normal, Trunk Movements Neck, shoulders, hips: None, normal, Overall Severity Severity of abnormal movements (highest score from questions above): None, normal Incapacitation due to abnormal movements: None, normal Patient's awareness of abnormal movements (rate only patient's report): No Awareness, Dental Status Current problems with teeth and/or dentures?: No Does patient usually wear dentures?: No  CIWA:  CIWA-Ar Total: 4 COWS:     Musculoskeletal: Strength & Muscle Tone: within normal limits Gait & Station: normal Patient leans: N/A  Psychiatric Specialty  Exam: Physical Exam  Constitutional: He is oriented to person, place, and time. He appears well-developed and well-nourished.  HENT:  Head: Normocephalic and atraumatic.  Neck: Neck supple.  Respiratory: Effort normal.  Neurological: He is alert and oriented to person, place, and time.  Psychiatric:  As above    ROS  Blood pressure 120/65, pulse 83, temperature 98.4 F (36.9 C), temperature source Oral, resp. rate 18, height 5\' 9"  (1.753 m), weight 65.8 kg (145 lb), SpO2 99 %.Body mass index is 21.41 kg/m.  General Appearance: Casual  Eye Contact:  Good  Speech:  Clear and Coherent and Normal Rate  Volume:  Normal  Mood:  Feeling better  Affect:  Appropriate  Thought Process:  Goal Directed  Orientation:  Full (Time, Place, and Person)  Thought Content:  Rumination  Suicidal Thoughts:  No  Homicidal Thoughts:  No  Memory:  Immediate;   Good Recent;   Good Remote;   Good  Judgement:  Good  Insight:  Good  Psychomotor Activity:  Normal  Concentration:  Concentration: Good and Attention Span: Good  Recall:  Good  Fund of Knowledge:  Good  Language:  Good  Akathisia:  Negative  Handed:    AIMS (if indicated):     Assets:  Communication Skills Desire for Improvement Resilience  ADL's:  Intact  Cognition:  WNL  Sleep:  Number of Hours: 6.5     Assessment and Plan   Patient is coming off alcohol well. Perceptual abnormalities are resolving. He is no longer a danger to self. We would evaluate him further.    Psychiatric: SUD SIMD  Medical:  Psychosocial:   PLAN: 1. Continue current regimen 2. Continue to monitor mood, behavior and interaction with peers      Georgiann Cocker, MD 11/28/2016, 6:54 PM

## 2016-11-28 NOTE — Progress Notes (Signed)
Nursing Progress Note: 7p-7a D: Pt currently presents with a anxious/pleasant affect and behavior. Pt states "you know I feel a lot better but I just feel off on the inside. Like I am so close to not being depressed but I am still depressed." Interacting appropriately with the milieu. Pt reports good sleep during the previous night with current medication regimen. Pt did attend wrap-up group.  A: Pt provided with medications per providers orders. Pt's labs and vitals were monitored throughout the night. Pt supported emotionally and encouraged to express concerns and questions. Pt educated on medications.  R: Pt's safety ensured with 15 minute and environmental checks. Pt currently denies SI, HI, and AVH. Pt verbally contracts to seek staff if SI,HI, or AVH occurs and to consult with staff before acting on any harmful thoughts. Will continue to monitor.

## 2016-11-28 NOTE — [facility] Group Notes (Signed)
LCSW Group Therapy Note   11/28/2016 1:15pm   Type of Therapy and Topic:  Group Therapy:  Overcoming Obstacles   Participation Level:  Active   Description of Group:    In this group patients will be encouraged to explore what they see as obstacles to their own wellness and recovery. They will be guided to discuss their thoughts, feelings, and behaviors related to these obstacles. The group will process together ways to cope with barriers, with attention given to specific choices patients can make. Each patient will be challenged to identify changes they are motivated to make in order to overcome their obstacles. This group will be process-oriented, with patients participating in exploration of their own experiences as well as giving and receiving support and challenge from other group members.   Therapeutic Goals: 1. Patient will identify personal and current obstacles as they relate to admission. 2. Patient will identify barriers that currently interfere with their wellness or overcoming obstacles.  3. Patient will identify feelings, thought process and behaviors related to these barriers. 4. Patient will identify two changes they are willing to make to overcome these obstacles:      Summary of Patient Progress  Ricardo [name] was attentive and engaged during today's processing group. He shared that he plans to start gardening and tending to his yard again. "That was a hobby that I really enjoyed." Ricardo [name] states that he thought he did not have family support prior to this admission, "but they are really behind me this time and offering to drive me to meetings and appts." Ricardo [name] appears to be improving in the group setting with improving insight.    Therapeutic Modalities:   Cognitive Behavioral Therapy Solution Focused Therapy Motivational Interviewing Relapse Prevention Therapy  Ledell Peoples Smart, LCSW 11/28/2016 12:54 PM

## 2016-11-28 NOTE — Progress Notes (Signed)
D: Pt was in dayroom upon initial approach.  Pt presents with anxious, depressed affect and mood.  His goal is "trying to feel better."  He complains of withdrawal symptoms of feeling "jittery and sick on my stomach."  Pt denies SI/HI, denies hallucinations, denies pain.  Pt has been visible in milieu interacting with peers and staff appropriately.  Pt attended evening group.    A: Introduced self to pt.  Actively listened to pt and offered support and encouragement. Medications administered per order.  PRN medication administered for withdrawal symptoms.  Q15 minute safety checks maintained.  R: Pt is safe on the unit.  Pt is compliant with medications.  Pt verbally contracts for safety.  Will continue to monitor and assess.

## 2016-11-28 NOTE — Progress Notes (Signed)
Pt attended AA meeting this evening.  

## 2016-11-28 NOTE — Plan of Care (Signed)
Problem: Coping: Goal: Ability to demonstrate self-control will improve Outcome: Progressing Pt has been in control of his behavior tonight.    

## 2016-11-28 NOTE — Progress Notes (Signed)
Patient denies SI, HI, and AVH this shift.   Patient reported an increase in withdrawal symptoms this shift and was noted to be very tremulous and and anxious.  Patient has attended groups and been compliant with medications.  Assess patient for safety, offer medications as prescribed, engage patient in 1:1 staff talks.   Patient able to contract for safety.  Continue to monitor as planned.

## 2016-11-29 NOTE — Progress Notes (Signed)
[facility] MD Progress Note  11/29/2016 5:34 PM Ricardo Ricardo  MRN:  [id number] Subjective:   56 yo Caucasian male, single. History of alcohol use disorder. Presented intoxicated with alcohol. BAL was 360 mg/dl. Expressed thoughts of suicide. Has access to gun powder and thought about blowing self up with it.   Chart reviewed today. Patient discussed at team.   Nursing staff reports that patient has been appropriate on the unit. Patient has been interacting well with peers. No behavioral issues. Patient has not voiced any suicidal thoughts. Patient has not been observed to be internally stimulated. Patient has been adherent with treatment recommendations. Patient has been tolerating their medication well.    Seen today. Patient is more hopeful and says he feels better. Negative ruminations are less. He has not heard the critical voice today.  Says he has realized how his substance use kept him away from his family. He is pleased they want him to get better. Says he would attend a local AA group. His family would drive him to and fro until he has friends that would help him. No side effects from recent medication adjustment. Tells me that he slept like a baby last night. Energy levels are not optimal yet. Encouraged that improvement is gradual.    Principal Problem: Substance or medication-induced depressive disorder with onset during intoxication [facility] Diagnosis:   Patient Active Problem List   Diagnosis Date Noted  . Alcohol use disorder, severe, dependence (HCC) [F10.20] 11/25/2016  . Substance or medication-induced depressive disorder with onset during intoxication [facility]) [F19.94] 11/25/2016   Total Time spent with patient: 20 minutes  Past Psychiatric History: As in H&P  Past Medical History:  Past Medical History:  Diagnosis Date  . Coronary artery disease    History reviewed. No pertinent surgical history. Family History:  Family History  Problem Relation Age of Onset  . Alcoholism Father     Family Psychiatric  History: As in H&P Social History:  History  Alcohol Use  . Yes     History  Drug Use  . Types: Marijuana    Social History   Social History  . Marital status: Single    Spouse name: N/A  . Number of children: N/A  . Years of education: N/A   Social History Main Topics  . Smoking status: Ricardo Ricardo    Types: Cigarettes  . Smokeless tobacco: Never Used  . Alcohol use Yes  . Drug use: Yes    Types: Marijuana  . Sexual activity: Not Asked   Other Topics Concern  . None   Social History Narrative  . None   Additional Social History:    Pain Medications: See MAR  Prescriptions: See MAR Over the Counter: Se MAR History of alcohol / drug use?: Yes Name of Substance 1: Alochol  1 - Age of First Use: UTA 1 - Amount (size/oz): UTA 1 - Frequency: UTA 1 - Duration: UTA 1 - Last Use / Amount: UTA       Sleep: Good  Appetite:  Good  Ricardo Medications: Ricardo Facility-Administered Medications  Medication Dose Route Frequency Provider Last Rate Last Dose  . acetaminophen (TYLENOL) tablet 650 mg  650 mg Oral Q6H PRN Okonkwo, Justina A, NP   650 mg at 11/26/16 2204  . alum & mag hydroxide-simeth (MAALOX/MYLANTA) 200-200-20 MG/5ML suspension 30 mL  30 mL Oral Q4H PRN Okonkwo, Justina A, NP   30 mL at 11/29/16 1249  . atorvastatin (LIPITOR) tablet 20 mg  20 mg Oral q1800 Okonkwo, Justina A, NP   20 mg at 11/29/16 1706  . cholecalciferol (VITAMIN D) tablet 1,000 Units  1,000 Units Oral Daily Okonkwo, Justina A, NP   1,000 Units at 11/29/16 0900  . docusate sodium (COLACE) capsule 100 mg  100 mg Oral BID Money, Feliz Beamravis B, FNP   100 mg at 11/29/16 1705  . feeding supplement (ENSURE ENLIVE) (ENSURE ENLIVE) liquid 237 mL  237 mL Oral BID BM Cobos, Rockey SituFernando A, MD   237 mL at 11/29/16 1500  . FLUoxetine (PROZAC) capsule 10 mg  10 mg Oral Daily Armandina StammerNwoko, Agnes I, NP   10 mg at 11/29/16 0859  . gabapentin (NEURONTIN) capsule 300 mg  300 mg Oral  TID Beryle Lathekonkwo, Justina A, NP   300 mg at 11/29/16 1705  . hydrOXYzine (ATARAX/VISTARIL) tablet 50 mg  50 mg Oral TID PRN Money, Gerlene Burdockravis B, FNP   50 mg at 11/29/16 1709  . lisinopril (PRINIVIL,ZESTRIL) tablet 20 mg  20 mg Oral Daily Okonkwo, Justina A, NP   20 mg at 11/29/16 0900  . magnesium hydroxide (MILK OF MAGNESIA) suspension 30 mL  30 mL Oral Daily PRN Okonkwo, Justina A, NP   30 mL at 11/28/16 2226  . multivitamin with minerals tablet 1 tablet  1 tablet Oral Daily Money, Gerlene Burdockravis B, FNP   1 tablet at 11/29/16 0900  . nicotine (NICODERM CQ - dosed in mg/24 hours) patch 21 mg  21 mg Transdermal Daily Money, Feliz Beamravis B, FNP   21 mg at 11/29/16 0800  . polyethylene glycol (MIRALAX / GLYCOLAX) packet 17 g  17 g Oral Daily PRN Money, Gerlene Burdockravis B, FNP   17 g at 11/29/16 1453  . thiamine (VITAMIN B-1) tablet 100 mg  100 mg Oral Daily Okonkwo, Justina A, NP   100 mg at 11/29/16 0859  . traZODone (DESYREL) tablet 200 mg  200 mg Oral QHS Okonkwo, Justina A, NP   200 mg at 11/28/16 2225    Lab Results: No results found for this or any previous visit (from the past 48 hour(s)).  Blood Alcohol level:  [facility] results found for: [facility]  Metabolic Disorder Labs: No results found for: HGBA1C, MPG No results found for: PROLACTIN No results found for: CHOL, TRIG, HDL, CHOLHDL, VLDL, LDLCALC  Physical Findings: AIMS: Facial and Oral Movements Muscles of Facial Expression: None, normal Lips and Perioral Area: None, normal Jaw: None, normal Tongue: None, normal,Extremity Movements Upper (arms, wrists, hands, fingers): None, normal Lower (legs, knees, ankles, toes): None, normal, Trunk Movements Neck, shoulders, hips: None, normal, Overall Severity Severity of abnormal movements (highest score from questions above): None, normal Incapacitation due to abnormal movements: None, normal Patient's awareness of abnormal movements (rate only patient's report): No Awareness, Dental Status Ricardo problems with teeth and/or  dentures?: No Does patient usually wear dentures?: No  CIWA:  CIWA-Ar Total: 2 COWS:     Musculoskeletal: Strength & Muscle Tone: within normal limits Gait & Station: normal Patient leans: N/A  Psychiatric Specialty Exam: Physical Exam  Constitutional: He is oriented to person, place, and time. He appears well-developed and well-nourished.  HENT:  Head: Normocephalic and atraumatic.  Neck: Neck supple.  Respiratory: Effort normal.  Neurological: He is alert and oriented to person, place, and time.  Psychiatric:  As above    ROS  Blood pressure 112/82, pulse (!) 102, temperature 98.4 F (36.9 C), temperature source Oral, resp. rate 16, height 5\' 9"  (1.753 m), weight 65.8 kg (145 lb), SpO2  99 %.Body mass index is 21.41 kg/m.  General Appearance: Calm and cooperative. Good relatedness. approprietly behavior.   Eye Contact:  Good  Speech:  Clear and Coherent and Normal Rate  Volume:  Normal  Mood:  Euthymic  Affect:  Full range and appropriate   Thought Process:  Goal Directed  Orientation:  Full (Time, Place, and Person)  Thought Content:  No delusional theme. No preoccupation with violent thoughts. No negative ruminations. No obsession.  No hallucination in any modality.   Suicidal Thoughts:  No  Homicidal Thoughts:  No  Memory:  Immediate;   Good Recent;   Good Remote;   Good  Judgement:  Good  Insight:  Good  Psychomotor Activity:  Normal  Concentration:  Concentration: Good and Attention Span: Good  Recall:  Good  Fund of Knowledge:  Good  Language:  Good  Akathisia:  Negative  Handed:    AIMS (if indicated):     Assets:  Communication Skills Desire for Improvement Resilience  ADL's:  Intact  Cognition:  WNL  Sleep:  Number of Hours: 6.25     Assessment and Plan   Patient has completely come off alcohol. Associated psychosis has resolved. He is not a danger to himself or others. He is scheduled for discharge tomorrow.     Psychiatric: SUD SIMD  Medical:  Psychosocial:   PLAN: 1. Continue Ricardo regimen 2. Continue to monitor mood, behavior and interaction with peers      Georgiann Cocker, MD 11/29/2016, 5:34 PMPatient ID: Ricardo Ricardo, male   DOB: [birth year], 56 y.o.   MRN: [id number]

## 2016-11-29 NOTE — Progress Notes (Signed)
Recreation Therapy Notes  Date:  11/29/16 Time: 0930 Location: [address] Dayroom  Group Topic: Stress Management  Goal Area(s) Addresses:  Patient will verbalize importance of using healthy stress management.  Patient will identify positive emotions associated with healthy stress management.   Intervention: Stress Management  Activity :  Guided Imagery.  LRT introduced the stress management technique of guided imagery.  LRT read a script so patients could follow along and imagine being in their peaceful place.  Patients were to follow along as LRT read the script to engage in the activity.  Education: Stress Management, Discharge Planning.   Education Outcome: Acknowledges edcuation/In group clarification offered/Needs additional education  Clinical Observations/Feedback:  Pt did not attend group.    Estha Few, LRT/CTRS        Krisinda Giovanni A 11/29/2016 12:30 PM 

## 2016-11-29 NOTE — [facility] Group Notes (Signed)
LCSW Group Therapy Note  11/29/2016 1:15pm  Type of Therapy/Topic:  Group Therapy:  Balance in Life  Participation Level:  Active  Description of Group:    This group will address the concept of balance and how it feels and looks when one is unbalanced. Patients will be encouraged to process areas in their lives that are out of balance and identify reasons for remaining unbalanced. Facilitators will guide patients in utilizing problem-solving interventions to address and correct the stressor making their life unbalanced. Understanding and applying boundaries will be explored and addressed for obtaining and maintaining a balanced life. Patients will be encouraged to explore ways to assertively make their unbalanced needs known to significant others in their lives, using other group members and facilitator for support and feedback.  Therapeutic Goals: 1. Patient will identify two or more emotions or situations they have that consume much of in their lives. 2. Patient will identify signs/triggers that life has become out of balance:  3. Patient will identify two ways to set boundaries in order to achieve balance in their lives:  4. Patient will demonstrate ability to communicate their needs through discussion and/or role plays  Summary of Patient Progress: Patient identified feeling out of balance in "my thoughts, they are extreme."  Reports mood fluctuation between depressed and energetic.  Uses substances to "talk to people because I am really shy, then I feel shame because I do things I don't want to remember.."  Identified need to find direction, "north star" in life, connected w childhood experience of star gazing and finding constellations.   "Right now, I don't even know where I am going"; was encouraged to make this his work.      Therapeutic Modalities:   Cognitive Behavioral Therapy Solution-Focused Therapy Assertiveness Training  Sallee Langenne C Zayd Bonet, [place] 11/29/2016 2:58 PM

## 2016-11-29 NOTE — Progress Notes (Signed)
Patient ID: Ricardo [name], male   DOB: 04/23/1960, 56 y.o.   MRN: [id number] D   Pt agrees to contract for safety and denies pain at this time.    Interacted well with peers and staff and has good eye contact.  Pt takes meds as asked and shows no sign of any adverse effects.  Pt appears clean and has clear thought process.  He shows no negative behaviors today  -- A --  Provide support and safety  ---  R --  Pt remains safe and pleasant on unit

## 2016-11-30 MED ORDER — FLUOXETINE HCL 10 MG PO CAPS: 10.0000 mg | ORAL_CAPSULE | Freq: Every day | ORAL | 0 refills | Status: DC

## 2016-11-30 MED ORDER — THIAMINE HCL 100 MG PO TABS: 100.0000 mg | ORAL_TABLET | Freq: Every day | ORAL | 0 refills | Status: DC

## 2016-11-30 MED ORDER — POLYETHYLENE GLYCOL 3350 17 G PO PACK: 17.0000 g | PACK | Freq: Every day | ORAL | 0 refills | Status: DC | PRN

## 2016-11-30 MED ORDER — ACETAMINOPHEN 325 MG PO TABS: 650.0000 mg | ORAL_TABLET | Freq: Four times a day (QID) | ORAL | Status: DC | PRN

## 2016-11-30 MED ORDER — VITAMIN D 1000 UNITS PO TABS: 2000.0000 [IU] | ORAL_TABLET | Freq: Every day | ORAL | Status: DC

## 2016-11-30 MED ORDER — DOCUSATE SODIUM 100 MG PO CAPS: 100.0000 mg | ORAL_CAPSULE | Freq: Two times a day (BID) | ORAL | 0 refills | Status: DC

## 2016-11-30 MED ORDER — ATORVASTATIN CALCIUM 20 MG PO TABS: 20.0000 mg | ORAL_TABLET | Freq: Every day | ORAL | Status: DC

## 2016-11-30 MED ORDER — HYDROXYZINE HCL 50 MG PO TABS: ORAL_TABLET | ORAL | 0 refills | Status: DC

## 2016-11-30 MED ORDER — TRAZODONE HCL 100 MG PO TABS: 200.0000 mg | ORAL_TABLET | Freq: Every day | ORAL | 0 refills | Status: DC

## 2016-11-30 MED ORDER — GABAPENTIN 300 MG PO CAPS: 300.0000 mg | ORAL_CAPSULE | Freq: Three times a day (TID) | ORAL | 0 refills | Status: DC

## 2016-11-30 MED ORDER — LISINOPRIL 20 MG PO TABS: 20.0000 mg | ORAL_TABLET | Freq: Every day | ORAL | Status: AC

## 2016-11-30 NOTE — Discharge Summary (Signed)
Physician Discharge Summary Note  Patient:  Ricardo [name] is an 56 y.o., male MRN:  [id number] DOB:  12-16-1960 Patient phone:  [phone] (home)  Patient address:   [address] [place] [address],  Total Time spent with patient: Greater than 30 minutes  Date of Admission:  11/24/2016 Date of Discharge: 11-30-16  Reason for Admission: Alcohol intoxication triggering suicidal ideations.  Principal Problem: Substance or medication-induced depressive disorder with onset during intoxication [facility])  Discharge Diagnoses: Patient Active Problem List   Diagnosis Date Noted  . Alcohol use disorder, severe, dependence (HCC) [F10.20] 11/25/2016  . Substance or medication-induced depressive disorder with onset during intoxication [facility]) [F19.94] 11/25/2016   Past Psychiatric History: See Alcohol use disorder.  Past Medical History:  Past Medical History:  Diagnosis Date  . Coronary artery disease    History reviewed. No pertinent surgical history.  Family History:  Family History  Problem Relation Age of Onset  . Alcoholism Father    Family Psychiatric  History: See H&P  Social History:  History  Alcohol Use  . Yes     History  Drug Use  . Types: Marijuana    Social History   Social History  . Marital status: Single    Spouse name: N/A  . Number of children: N/A  . Years of education: N/A   Social History Main Topics  . Smoking status: Current Every Day Smoker    Types: Cigarettes  . Smokeless tobacco: Never Used  . Alcohol use Yes  . Drug use: Yes    Types: Marijuana  . Sexual activity: Not Asked   Other Topics Concern  . None   Social History Narrative  . None   Hospital Course: Ricardo [name] is a 56 y.o Caucasian male, single, lives with his mom. History of alcohol use disorder. Presented intoxicated with alcohol. BAL was 360 mg/dl. Expressed thoughts of suicide. Negative ruminations about how use of alcohol has affected his life. Has access to gun powder and  thought about blowing self up with it.  After evaluation of his presenting symptoms, it was determined that Ricardo [name] was intoxicated & needed alcohol detoxification treatments. He was also presenting with symptoms of depression. He received Librium detoxification treatment protocols. He was also medicated & discharged ob; Prozac 10 mg for depression, Gabapentin 300 mg for agitation/alcohol withdrawal syndrome, Hydroxyzine 50 mg for anxiety & Trazodone 100 mg for insomnia. He was also enrolled in the group counseling sessions being offered & held on this unit. He learned coping skills. Ricardo [name] was resumed on all his pertinent home medications for the other pre-existing medical issues presented.  Ricardo [name] was seen today for discharge assessment. Has completely come off alcohol. No craving for substances. Plans to engage with AA after discharge. Has very good support from his family.  Reports that he is in good spirits. Not feeling depressed. Reports normal energy and interest. Has been maintaining normal biological functions. He is able to think clearly. He is able to focus on task. His thoughts are not crowded or racing. No evidence of mania. No hallucination in any modality. He is not making any delusional statement. No passivity of will/thought. He is fully in touch with reality. No thoughts of suicide. No thoughts of homicide. No violent thoughts. No overwhelming anxiety.  The staff reports that he has been in good spirits. He has normal anxiety of going back home. Worries he might fall short of expectations again. Not expressing any suicidal thoughts. No evidence of withdrawals. Has  not exhibited any abnormal behavior.   Patient was discussed at the treatment team meeting this morning. The team members feel that patient is back to his baseline level of function. The team agrees with plan to discharge patient today to continue mental health care & substance abuse treatment on an outpatient basis as noted below. He  was provided with all the necessary information needed to make this appointment without problems. He left [facility] with all personal belongings in no apparent distress. Transportation per [place] bus. [facility] assisted with bus pass..  Physical Findings: AIMS: Facial and Oral Movements Muscles of Facial Expression: None, normal Lips and Perioral Area: None, normal Jaw: None, normal Tongue: None, normal,Extremity Movements Upper (arms, wrists, hands, fingers): None, normal Lower (legs, knees, ankles, toes): None, normal, Trunk Movements Neck, shoulders, hips: None, normal, Overall Severity Severity of abnormal movements (highest score from questions above): None, normal Incapacitation due to abnormal movements: None, normal Patient's awareness of abnormal movements (rate only patient's report): No Awareness, Dental Status Current problems with teeth and/or dentures?: No Does patient usually wear dentures?: No  CIWA:  CIWA-Ar Total: 0 COWS:     Musculoskeletal: Strength & Muscle Tone: within normal limits Gait & Station: normal Patient leans: N/A  Psychiatric Specialty Exam: Physical Exam  Constitutional: He appears well-developed.  HENT:  Head: Normocephalic.  Eyes: Pupils are equal, round, and reactive to light.  Neck: Normal range of motion.  Cardiovascular: Normal rate.   Respiratory: Effort normal.  GI: Soft.  Genitourinary:  Genitourinary Comments: Deferred  Musculoskeletal: Normal range of motion.  Neurological: He is alert.  Skin: Skin is warm.    Review of Systems  Constitutional: Negative.   HENT: Negative.   Eyes: Negative.   Respiratory: Negative.   Cardiovascular: Negative.   Gastrointestinal: Negative.   Genitourinary: Negative.   Musculoskeletal: Negative.   Skin: Negative.   Neurological: Negative.   Endo/Heme/Allergies: Negative.   Psychiatric/Behavioral: Positive for depression (Stable), hallucinations (Hx. auditory hallucinations.) and substance abuse (Hx.  Alcoholism). Negative for memory loss and suicidal ideas. The patient has insomnia (Stable). The patient is not nervous/anxious.     Blood pressure 122/67, pulse (!) 101, temperature 98.4 F (36.9 C), temperature source Oral, resp. rate 20, height 5\' 9"  (1.753 m), weight 65.8 kg (145 lb), SpO2 99 %.Body mass index is 21.41 kg/m.  See H&P   Have you used any form of tobacco in the last 30 days? (Cigarettes, Smokeless Tobacco, Cigars, and/or Pipes): Yes  Has this patient used any form of tobacco in the last 30 days? (Cigarettes, Smokeless Tobacco, Cigars, and/or Pipes): No  Blood Alcohol level:  No results found for: [facility]  Metabolic Disorder Labs:  No results found for: HGBA1C, MPG No results found for: PROLACTIN No results found for: CHOL, TRIG, HDL, CHOLHDL, VLDL, LDLCALC  See Psychiatric Specialty Exam and Suicide Risk Assessment completed by Attending Physician prior to discharge.  Discharge destination:  Home  Is patient on multiple antipsychotic therapies at discharge:  No   Has Patient had three or more failed trials of antipsychotic monotherapy by history:  No  Recommended Plan for Multiple Antipsychotic Therapies: NA  Allergies as of 11/30/2016   No Known Allergies     Medication List    STOP taking these medications   albuterol (2.5 MG/3ML) 0.083% nebulizer solution Commonly known as:  PROVENTIL   albuterol 108 (90 Base) MCG/ACT inhaler Commonly known as:  PROVENTIL HFA;VENTOLIN HFA   aspirin EC 81 MG tablet   diazepam 2  MG tablet Commonly known as:  VALIUM   doxepin 100 MG capsule Commonly known as:  SINEQUAN   hydrOXYzine 25 MG capsule Commonly known as:  VISTARIL   lisinopril-hydrochlorothiazide 20-25 MG tablet Commonly known as:  PRINZIDE,ZESTORETIC   loratadine 10 MG tablet Commonly known as:  CLARITIN   loxapine 25 MG capsule Commonly known as:  LOXITANE   meloxicam 7.5 MG tablet Commonly known as:  MOBIC   metoprolol succinate 25 MG 24 hr  tablet Commonly known as:  TOPROL-XL   mirtazapine 15 MG tablet Commonly known as:  REMERON   NEXIUM 40 MG capsule Generic drug:  esomeprazole   OLANZapine 15 MG tablet Commonly known as:  ZYPREXA   oxyCODONE-acetaminophen 5-325 MG tablet Commonly known as:  PERCOCET   pantoprazole 40 MG tablet Commonly known as:  PROTONIX   QUEtiapine 300 MG tablet Commonly known as:  SEROQUEL   simvastatin 80 MG tablet Commonly known as:  ZOCOR     TAKE these medications     Indication  acetaminophen 325 MG tablet Commonly known as:  TYLENOL Take 2 tablets (650 mg total) by mouth every 6 (six) hours as needed for mild pain, fever or headache.  Indication:  Fever, Pain   atorvastatin 20 MG tablet Commonly known as:  LIPITOR Take 1 tablet (20 mg total) by mouth daily. For high cholesterol What changed:  See the new instructions.  Indication:  Inherited Homozygous Hypercholesterolemia, High Amount of Triglycerides in the Blood   cholecalciferol 1000 units tablet Commonly known as:  VITAMIN D Take 2 tablets (2,000 Units total) by mouth daily. For bone health What changed:  additional instructions  Indication:  Bone health   docusate sodium 100 MG capsule Commonly known as:  COLACE Take 1 capsule (100 mg total) by mouth 2 (two) times daily. (may buy from over the counter at the pharmacy): For constipation  Indication:  Constipation   FLUoxetine 10 MG capsule Commonly known as:  PROZAC Take 1 capsule (10 mg total) by mouth daily. For depression  Indication:  Major Depressive Disorder   gabapentin 300 MG capsule Commonly known as:  NEURONTIN Take 1 capsule (300 mg total) by mouth 3 (three) times daily. For agitation What changed:  See the new instructions.  Indication:  Agitation   hydrOXYzine 50 MG tablet Commonly known as:  ATARAX/VISTARIL Take 1 tablet (25 mg) four times daily as needed: For anxiety  Indication:  Feeling Anxious   lisinopril 20 MG tablet Commonly known  as:  PRINIVIL,ZESTRIL Take 1 tablet (20 mg total) by mouth daily. For high blood pressure What changed:  See the new instructions.  Indication:  High Blood Pressure Disorder   polyethylene glycol packet Commonly known as:  MIRALAX / GLYCOLAX Take 17 g by mouth daily as needed for mild constipation. (May buy from over the counter): For constipation  Indication:  Constipation   thiamine 100 MG tablet Take 1 tablet (100 mg total) by mouth daily. For low thiamine  Indication:  Deficiency in Thiamine or Vitamin B1   traZODone 100 MG tablet Commonly known as:  DESYREL Take 2 tablets (200 mg total) by mouth at bedtime. For sleep What changed:  how much to take  when to take this  additional instructions  Indication:  Trouble Sleeping      [facility] [employer] Call on 12/01/2016.   Why:  Hospital follow-up on Friday at 1:00PM. Thank you.  Contact information: [address] [place]  [place] [address] [phone]          Follow-up recommendations: Activity:  As tolerated Diet: As recommended by your primary care doctor. Keep all scheduled follow-up appointments as recommended.  Comments: Patient is instructed prior to discharge to: Take all medications as prescribed by his/her mental healthcare provider. Report any adverse effects and or reactions from the medicines to his/her outpatient provider promptly. Patient has been instructed & cautioned: To not engage in alcohol and or illegal drug use while on prescription medicines. In the event of worsening symptoms, patient is instructed to call the crisis hotline, 911 and or go to the nearest ED for appropriate evaluation and treatment of symptoms. To follow-up with his/her primary care provider for your other medical issues, concerns and or health care needs.   Signed: Sanjuana Kava, NP, PMHNP, FNP-BC 11/30/2016, 10:24 AM

## 2016-11-30 NOTE — Progress Notes (Signed)
  [facility] :  Will you be returning to the same living situation after discharge:  Yes,  home At discharge, do you have transportation home?: Yes,  bus pass and PART pass in chart Do you have the ability to pay for your medications: Yes,  [facility]  Release of information consent forms completed and submitted to medical records by CSW.  Patient to Follow up at: Follow-up Information    [facility] Call on 12/01/2016.   Why:  Hospital follow-up on Friday at 1:00PM. Thank you.  Contact information: [address] [place] [address] [phone]           Next level of care provider has access to [facility] Link:no  Safety Planning and Suicide Prevention discussed: Yes,  SPE completed with pt; pt declined to consent to family contact. SPI pamphlet and mobile crisis information provided to pt.   Have you used any form of tobacco in the last 30 days? (Cigarettes, Smokeless Tobacco, Cigars, and/or Pipes): Yes  Has patient been referred to the Quitline?: Patient refused referral  Patient has been referred for addiction treatment: Yes  Pulte HomesHeather N Smart, LCSW 11/30/2016, 9:02 AM

## 2016-11-30 NOTE — [facility] Suicide Risk Assessment (Signed)
[facility]   Principal Problem: Substance or medication-induced depressive disorder with onset during intoxication [facility](HCC) Discharge Diagnoses:  Patient Active Problem List   Diagnosis Date Noted  . Alcohol use disorder, severe, dependence (HCC) [F10.20] 11/25/2016  . Substance or medication-induced depressive disorder with onset during intoxication [facility](HCC) [F19.94] 11/25/2016    Total Time spent with patient: 30 minutes  Musculoskeletal: Strength & Muscle Tone: within normal limits Gait & Station: normal Patient leans: N/A  Psychiatric Specialty Exam: Review of Systems  Constitutional: Negative.   HENT: Negative.   Eyes: Negative.   Respiratory: Negative.   Cardiovascular: Negative.   Gastrointestinal: Negative.   Genitourinary: Negative.   Musculoskeletal: Negative.   Skin: Negative.   Neurological: Negative.   Endo/Heme/Allergies: Negative.   Psychiatric/Behavioral: Negative for depression, hallucinations, memory loss and suicidal ideas. The patient is not nervous/anxious and does not have insomnia.     Blood pressure 112/82, pulse (!) 102, temperature 98.4 F (36.9 C), temperature source Oral, resp. rate 16, height 5\' 9"  (1.753 m), weight 65.8 kg (145 lb), SpO2 99 %.Body mass index is 21.41 kg/m.  General Appearance: Neatly dressed, pleasant, engaging well and cooperative. Appropriate behavior. Not in any distress. Good relatedness. Not internally stimulated.  Eye Contact::  Good  Speech:  Spontaneous, normal prosody. Normal tone and rate.   Volume:  Normal  Mood:  Euthymic  Affect:  Appropriate and Full Range  Thought Process:  Goal Directed and Linear  Orientation:  Full (Time, Place, and Person)  Thought Content:  Future oriented. No delusional theme. No preoccupation with violent thoughts. No negative ruminations. No obsession.  No hallucination in any modality.   Suicidal Thoughts:  No  Homicidal Thoughts:  No  Memory:  Immediate;    Good Recent;   Good Remote;   Good  Judgement:  Good  Insight:  Fair  Psychomotor Activity:  Normal  Concentration:  Good  Recall:  Good  Fund of Knowledge:Good  Language: Good  Akathisia:  Negative  Handed:    AIMS (if indicated):     Assets:  Communication Skills Desire for Improvement Housing Resilience Social Support  Sleep:  Number of Hours: 5.25  Cognition: WNL  ADL's:  Intact   Clinical Assessment::   56 y.o Caucasian male, single, lives with his mom. History of alcohol use disorder. Presented intoxicated with alcohol. BAL was 360 mg/dl. Expressed thoughts of suicide. Negative ruminations about how use of alcohol has affected his life. Has access to gun powder and thought about blowing self up with it.  Seen today. Has completely come off alcohol. No craving for substances. Plans to engage with AA. Has very good support from his family.  Reports that he is in good spirits. Not feeling depressed. Reports normal energy and interest. Has been maintaining normal biological functions. He is able to think clearly. He is able to focus on task. His thoughts are not crowded or racing. No evidence of mania. No hallucination in any modality. He is not making any delusional statement. No passivity of will/thought. He is fully in touch with reality. No thoughts of suicide. No thoughts of homicide. No violent thoughts. No overwhelming anxiety.  Staff reports that he has been in good spirits. Normal anxiety of going back home. Worries he might fall short of expectations again. Not expressing any suicidal thoughts. No evidence of withdrawals. Has not exhibited any abnormal behavior.   Patient was discussed at team. Team members feels that patient is back to his baseline  level of function. Team agrees with plan to discharge patient today.  Demographic Factors:  Male, Caucasian, Low socioeconomic status and Unemployed  Loss Factors: NA  Historical Factors: Prior suicide attempts and  Impulsivity  Risk Reduction Factors:   Lives with his family Support from his mom and sibling Linked to [place]. Has a sponsor Now sober   Continued Clinical Symptoms:  As above  Cognitive Features That Contribute To Risk:  None    Suicide Risk:  Minimal: No identifiable suicidal ideation.  Patient is not having any thoughts of suicide at this time. Modifiable risk factors targeted during this admission includes depression and substance use. Demographical and historical risk factors cannot be modified. Patient is now engaging well. Patient is reliable and is future oriented. We have buffered patient's support structures. At this point, patient is at low risk of suicide. Patient is aware of the effects of psychoactive substances on decision making process. Patient has been provided with emergency contacts. Patient acknowledges to use resources provided if unforseen circumstances changes their current risk stratification.   Follow-up Information    [facility]. Call on 12/01/2016.   Why:  Hospital follow-up on Friday at 1:00PM. Thank you.  Contact information: [address] [place] [address] [phone]           Plan Of Care/Follow-up recommendations:  1. Continue current psychotropic medications 2. Mental health and addiction follow up as arranged.  3. Discharge in care of his family 4. Provided limited quantity of prescriptions   Georgiann Cocker, MD 11/30/2016, 9:41 AM

## 2016-11-30 NOTE — Progress Notes (Signed)
Discharge note: Pt received both written and verbal discharge instructions. Pt verbalized understanding of discharge instructions. Pt agreed to f/u appt and med regimen. Pt received prescriptions, SRA, AVS and transitional record. Pt received belongings from room and locker. Pt [place] bus [place] from CSW. Pt safely discharged to the parking lot.

## 2016-12-15 DIAGNOSIS — E785 Hyperlipidemia, unspecified: Secondary | ICD-10-CM | POA: Diagnosis present

## 2016-12-18 DIAGNOSIS — G4733 Obstructive sleep apnea (adult) (pediatric): Secondary | ICD-10-CM

## 2016-12-18 DIAGNOSIS — J449 Chronic obstructive pulmonary disease, unspecified: Secondary | ICD-10-CM | POA: Diagnosis present

## 2016-12-18 DIAGNOSIS — I10 Essential (primary) hypertension: Secondary | ICD-10-CM | POA: Diagnosis present

## 2017-01-17 ENCOUNTER — Telehealth: Payer: Self-pay

## 2017-01-17 NOTE — Telephone Encounter (Signed)
Called patient to inform of med request from [facility]. Patient did not wish to [facility]. Informed patient to [facility]. Denial was faxed to pharmacy with reasoning. Craige CottaAshley S Keyandre Pileggi

## 2017-11-26 DIAGNOSIS — Z955 Presence of coronary angioplasty implant and graft: Secondary | ICD-10-CM

## 2018-08-20 DIAGNOSIS — M545 Low back pain: Secondary | ICD-10-CM

## 2018-08-20 DIAGNOSIS — I2510 Atherosclerotic heart disease of native coronary artery without angina pectoris: Secondary | ICD-10-CM

## 2018-08-20 DIAGNOSIS — K922 Gastrointestinal hemorrhage, unspecified: Secondary | ICD-10-CM | POA: Diagnosis not present

## 2018-08-20 DIAGNOSIS — R109 Unspecified abdominal pain: Secondary | ICD-10-CM | POA: Diagnosis not present

## 2018-08-20 DIAGNOSIS — I10 Essential (primary) hypertension: Secondary | ICD-10-CM | POA: Diagnosis not present

## 2018-08-20 DIAGNOSIS — F1010 Alcohol abuse, uncomplicated: Secondary | ICD-10-CM | POA: Diagnosis not present

## 2018-08-21 DIAGNOSIS — M545 Low back pain: Secondary | ICD-10-CM | POA: Diagnosis not present

## 2018-08-21 DIAGNOSIS — I2510 Atherosclerotic heart disease of native coronary artery without angina pectoris: Secondary | ICD-10-CM | POA: Diagnosis not present

## 2018-08-21 DIAGNOSIS — K922 Gastrointestinal hemorrhage, unspecified: Secondary | ICD-10-CM | POA: Diagnosis not present

## 2018-08-21 DIAGNOSIS — I10 Essential (primary) hypertension: Secondary | ICD-10-CM | POA: Diagnosis not present

## 2018-08-22 DIAGNOSIS — I2510 Atherosclerotic heart disease of native coronary artery without angina pectoris: Secondary | ICD-10-CM | POA: Diagnosis not present

## 2018-08-22 DIAGNOSIS — K219 Gastro-esophageal reflux disease without esophagitis: Secondary | ICD-10-CM

## 2018-08-22 DIAGNOSIS — M545 Low back pain: Secondary | ICD-10-CM | POA: Diagnosis not present

## 2018-08-22 DIAGNOSIS — I10 Essential (primary) hypertension: Secondary | ICD-10-CM | POA: Diagnosis not present

## 2021-09-23 ENCOUNTER — Other Ambulatory Visit: Payer: Self-pay | Admitting: *Deleted

## 2021-09-23 DIAGNOSIS — M25569 Pain in unspecified knee: Secondary | ICD-10-CM

## 2021-10-12 ENCOUNTER — Encounter: Payer: Self-pay | Admitting: Vascular Surgery

## 2021-10-12 ENCOUNTER — Other Ambulatory Visit: Payer: Self-pay | Admitting: Vascular Surgery

## 2021-10-12 ENCOUNTER — Ambulatory Visit (HOSPITAL_COMMUNITY)
Admission: RE | Admit: 2021-10-12 | Discharge: 2021-10-12 | Disposition: A | Discharge status: Home / Self Care | Payer: Medicaid Other | Source: Ambulatory Visit | Attending: Vascular Surgery | Admitting: Vascular Surgery

## 2021-10-12 ENCOUNTER — Ambulatory Visit (INDEPENDENT_AMBULATORY_CARE_PROVIDER_SITE_OTHER): Payer: Medicaid Other | Admitting: Vascular Surgery

## 2021-10-12 VITALS — BP 160/97 | HR 72 | Temp 98.1°F | Resp 20 | Ht 69.0 in | Wt 153.8 lb

## 2021-10-12 DIAGNOSIS — I739 Peripheral vascular disease, unspecified: Secondary | ICD-10-CM

## 2021-10-12 DIAGNOSIS — M25569 Pain in unspecified knee: Secondary | ICD-10-CM | POA: Diagnosis not present

## 2021-10-12 NOTE — Progress Notes (Signed)
Patient ID: Ricardo Ricardo [name], male   DOB: 1961-02-02, 61 y.o.   MRN: [id number]  Reason for Consult: New Patient (Initial Visit)   Referred by Ricardo Scott, MD  Subjective:     HPI:  Ricardo Ricardo [name] is a 61 y.o. male with history of coronary artery disease and history of intervention.  He does not take any aspirin or other antiplatelet agents but does take a statin.  He has very short distance less than 50 yards claudication which is life limiting for him.  He denies any tissue loss or ulceration.  He does not have any issue with impotence.  No previous vascular intervention.  He is a Ricardo [name] every day smoker.  Past Medical History:  Diagnosis Date   Coronary artery disease    Hyperlipidemia    Hypertension    Family History  Problem Relation Age of Onset   Alcoholism Father    Past Surgical History:  Procedure Laterality Date   CORONARY ANGIOPLASTY WITH STENT PLACEMENT      Short Social History:  Social History   Tobacco Use   Smoking status: Every Day    Packs/day: 1.00    Types: Cigarettes   Smokeless tobacco: Never  Substance Use Topics   Alcohol use: Yes    Allergies  Allergen Reactions   Bee Venom Anaphylaxis    Ricardo [name] Outpatient Medications  Medication Sig Dispense Refill   acetaminophen (TYLENOL) 325 MG tablet Take 2 tablets (650 mg total) by mouth every 6 (six) hours as needed for mild pain, fever or headache.     atorvastatin (LIPITOR) 20 MG tablet Take 1 tablet (20 mg total) by mouth daily. For high cholesterol     cholecalciferol (VITAMIN D) 1000 units tablet Take 2 tablets (2,000 Units total) by mouth daily. For bone health     docusate sodium (COLACE) 100 MG capsule Take 1 capsule (100 mg total) by mouth 2 (two) times daily. (may buy from over the counter at the pharmacy): For constipation 1 capsule 0   FLUoxetine (PROZAC) 10 MG capsule Take 1 capsule (10 mg total) by mouth daily. For depression 30 capsule 0   gabapentin (NEURONTIN) 300 MG capsule Take 1  capsule (300 mg total) by mouth 3 (three) times daily. For agitation 90 capsule 0   hydrOXYzine (ATARAX/VISTARIL) 50 MG tablet Take 1 tablet (25 mg) four times daily as needed: For anxiety 60 tablet 0   lisinopril (PRINIVIL,ZESTRIL) 20 MG tablet Take 1 tablet (20 mg total) by mouth daily. For high blood pressure     polyethylene glycol (MIRALAX / GLYCOLAX) packet Take 17 g by mouth daily as needed for mild constipation. (May buy from over the counter): For constipation 14 each 0   thiamine 100 MG tablet Take 1 tablet (100 mg total) by mouth daily. For low thiamine 30 tablet 0   traZODone (DESYREL) 100 MG tablet Take 2 tablets (200 mg total) by mouth at bedtime. For sleep 30 tablet 0   No Ricardo [name] facility-administered medications for this visit.    Review of Systems  Constitutional:  Constitutional negative. HENT: HENT negative.  Eyes: Eyes negative.  Cardiovascular: Positive for claudication.  GI: Gastrointestinal negative.  Musculoskeletal: Positive for leg pain.  Neurological: Neurological negative. Hematologic: Hematologic/lymphatic negative.  Psychiatric: Psychiatric negative.        Objective:  Objective   Vitals:   10/12/21 1427  BP: (!) 160/97  Pulse: 72  Resp: 20  Temp: 98.1 F (36.7 C)  SpO2: 96%  Weight: 153 lb 12.8 oz (69.8 kg)  Height: 5\' 9"  (1.753 m)   Body mass index is 22.71 kg/m.  Physical Exam HENT:     Head: Normocephalic.  Eyes:     Pupils: Pupils are equal, round, and reactive to light.  Neck:     Vascular: No carotid bruit.  Cardiovascular:     Rate and Rhythm: Normal rate.     Pulses:          Femoral pulses are 0 on the right side and 2+ on the left side.      Popliteal pulses are 0 on the right side and 0 on the left side.     Heart sounds: Normal heart sounds.  Pulmonary:     Effort: Pulmonary effort is normal.  Abdominal:     General: Abdomen is flat.     Palpations: Abdomen is soft.  Musculoskeletal:        General: Normal range of  motion.     Cervical back: Neck supple.     Right lower leg: No edema.     Left lower leg: No edema.  Skin:    General: Skin is warm and dry.     Capillary Refill: Capillary refill takes 2 to 3 seconds.  Neurological:     General: No focal deficit present.     Mental Status: He is alert.  Psychiatric:        Mood and Affect: Mood normal.        Behavior: Behavior normal.        Thought Content: Thought content normal.        Judgment: Judgment normal.     Data: ABI Findings:  +---------+------------------+-----+----------+--------+  Right    Rt Pressure (mmHg)IndexWaveform  Comment   +---------+------------------+-----+----------+--------+  Brachial 173                                        +---------+------------------+-----+----------+--------+  PTA                             absent              +---------+------------------+-----+----------+--------+  PERO     86                0.50 monophasic          +---------+------------------+-----+----------+--------+  DP       107               0.62 monophasic          +---------+------------------+-----+----------+--------+  Great Toe82                0.47 Abnormal            +---------+------------------+-----+----------+--------+   +---------+------------------+-----+----------+-------+  Left     Lt Pressure (mmHg)IndexWaveform  Comment  +---------+------------------+-----+----------+-------+  Brachial 173                                       +---------+------------------+-----+----------+-------+  PTA      147               0.85 monophasicbrisk    +---------+------------------+-----+----------+-------+  PERO     130  0.75 biphasic           +---------+------------------+-----+----------+-------+  DP       137               0.79 biphasic           +---------+------------------+-----+----------+-------+  Great Toe134               0.77  Normal             +---------+------------------+-----+----------+-------+   +-------+-----------+-----------+------------+------------+  ABI/TBIToday's ABIToday's TBIPrevious ABIPrevious TBI  +-------+-----------+-----------+------------+------------+  Right  .62        .47                                  +-------+-----------+-----------+------------+------------+  Left   .85        .77                                  +-------+-----------+-----------+------------+------------+          Assessment/Plan:    61 year old male with right lower extremity very short distance claudication with evidence of inflow disease on the right with a strongly palpable left common femoral pulse.  Plan will be for angiography from the left common femoral approach.  We have discussed smoking cessation and he demonstrates good understanding.  I have asked him to begin aspirin along with his statin drug.  We will get him set up for aortogram from the left common femoral approach on a Monday in the very near future.     Maeola Harman MD [facility]

## 2021-10-14 ENCOUNTER — Other Ambulatory Visit: Payer: Self-pay

## 2021-10-14 DIAGNOSIS — I739 Peripheral vascular disease, unspecified: Secondary | ICD-10-CM

## 2021-10-17 ENCOUNTER — Ambulatory Visit (HOSPITAL_COMMUNITY): Admission: RE | Admit: 2021-10-17 | Payer: Medicaid Other | Source: Ambulatory Visit | Admitting: Vascular Surgery

## 2021-10-17 ENCOUNTER — Encounter (HOSPITAL_COMMUNITY): Admission: RE | Payer: Self-pay | Source: Ambulatory Visit

## 2021-10-17 SURGERY — ABDOMINAL AORTOGRAM W/LOWER EXTREMITY
Anesthesia: LOCAL

## 2021-10-26 ENCOUNTER — Other Ambulatory Visit: Payer: Self-pay

## 2021-10-26 ENCOUNTER — Telehealth: Payer: Self-pay

## 2021-10-26 NOTE — Telephone Encounter (Signed)
Pt did not show up to his procedure last week. His case worker Lenard Lance called today to let us know he did not have transportation last week and asked her to call us to r/s. He has been r/s for 11/14/21. She was given his pre op instructions and date/time. I have tried to call pt to give him this info directly but his VM box is full.

## 2021-10-26 NOTE — Progress Notes (Signed)
Opened in error

## 2021-11-14 ENCOUNTER — Encounter (HOSPITAL_COMMUNITY)
Admission: RE | Disposition: A | Discharge status: Home / Self Care | Payer: Self-pay | Source: Ambulatory Visit | Attending: Vascular Surgery

## 2021-11-14 ENCOUNTER — Other Ambulatory Visit: Payer: Self-pay

## 2021-11-14 ENCOUNTER — Observation Stay (HOSPITAL_COMMUNITY)
Admission: RE | Admit: 2021-11-14 | Discharge: 2021-11-15 | Disposition: A | Discharge status: Home / Self Care | Payer: Medicaid Other | Source: Ambulatory Visit | Attending: Vascular Surgery | Admitting: Vascular Surgery

## 2021-11-14 DIAGNOSIS — I2510 Atherosclerotic heart disease of native coronary artery without angina pectoris: Secondary | ICD-10-CM | POA: Diagnosis not present

## 2021-11-14 DIAGNOSIS — F17210 Nicotine dependence, cigarettes, uncomplicated: Secondary | ICD-10-CM | POA: Insufficient documentation

## 2021-11-14 DIAGNOSIS — Z955 Presence of coronary angioplasty implant and graft: Secondary | ICD-10-CM | POA: Insufficient documentation

## 2021-11-14 DIAGNOSIS — Z79899 Other long term (current) drug therapy: Secondary | ICD-10-CM | POA: Diagnosis not present

## 2021-11-14 DIAGNOSIS — I10 Essential (primary) hypertension: Secondary | ICD-10-CM | POA: Insufficient documentation

## 2021-11-14 DIAGNOSIS — F19929 Other psychoactive substance use, unspecified with intoxication, unspecified: Secondary | ICD-10-CM

## 2021-11-14 DIAGNOSIS — I70211 Atherosclerosis of native arteries of extremities with intermittent claudication, right leg: Principal | ICD-10-CM | POA: Insufficient documentation

## 2021-11-14 DIAGNOSIS — F1020 Alcohol dependence, uncomplicated: Secondary | ICD-10-CM

## 2021-11-14 DIAGNOSIS — I739 Peripheral vascular disease, unspecified: Secondary | ICD-10-CM | POA: Diagnosis present

## 2021-11-14 DIAGNOSIS — F1994 Other psychoactive substance use, unspecified with psychoactive substance-induced mood disorder: Secondary | ICD-10-CM

## 2021-11-14 HISTORY — PX: ABDOMINAL AORTOGRAM W/LOWER EXTREMITY: CATH118223

## 2021-11-14 LAB — CBC
HCT: 35.1 % — ABNORMAL LOW (ref 39.0–52.0)
Hemoglobin: 12 g/dL — ABNORMAL LOW (ref 13.0–17.0)
MCH: 34.7 pg — ABNORMAL HIGH (ref 26.0–34.0)
MCHC: 34.2 g/dL (ref 30.0–36.0)
MCV: 101.4 fL — ABNORMAL HIGH (ref 80.0–100.0)
Platelets: 235 10*3/uL (ref 150–400)
RBC: 3.46 MIL/uL — ABNORMAL LOW (ref 4.22–5.81)
RDW: 15 % (ref 11.5–15.5)
WBC: 7.3 10*3/uL (ref 4.0–10.5)
nRBC: 0 % (ref 0.0–0.2)

## 2021-11-14 LAB — POCT I-STAT, CHEM 8
BUN: 27 mg/dL — ABNORMAL HIGH (ref 6–20)
Calcium, Ion: 1.11 mmol/L — ABNORMAL LOW (ref 1.15–1.40)
Chloride: 110 mmol/L (ref 98–111)
Creatinine, Ser: 1.4 mg/dL — ABNORMAL HIGH (ref 0.61–1.24)
Glucose, Bld: 102 mg/dL — ABNORMAL HIGH (ref 70–99)
HCT: 39 % (ref 39.0–52.0)
Hemoglobin: 13.3 g/dL (ref 13.0–17.0)
Potassium: 3.3 mmol/L — ABNORMAL LOW (ref 3.5–5.1)
Sodium: 144 mmol/L (ref 135–145)
TCO2: 22 mmol/L (ref 22–32)

## 2021-11-14 LAB — CREATININE, SERUM
Creatinine, Ser: 1.27 mg/dL — ABNORMAL HIGH (ref 0.61–1.24)
GFR, Estimated: >60 mL/min (ref >60)

## 2021-11-14 SURGERY — ABDOMINAL AORTOGRAM W/LOWER EXTREMITY
Anesthesia: LOCAL | Laterality: Bilateral

## 2021-11-14 MED ORDER — POTASSIUM CHLORIDE CRYS ER 20 MEQ PO TBCR
20.0000 meq | EXTENDED_RELEASE_TABLET | Freq: Once | ORAL | Status: AC
  Administered 2021-11-14: 40 meq via ORAL
  Filled 2021-11-14: qty 2

## 2021-11-14 MED ORDER — MORPHINE SULFATE (PF) 2 MG/ML IV SOLN: 2.0000 mg | INTRAVENOUS | Status: DC | PRN

## 2021-11-14 MED ORDER — SODIUM CHLORIDE 0.9% FLUSH
3.0000 mL | Freq: Two times a day (BID) | INTRAVENOUS | Status: DC
  Administered 2021-11-15: 3 mL via INTRAVENOUS

## 2021-11-14 MED ORDER — ALBUTEROL SULFATE (2.5 MG/3ML) 0.083% IN NEBU
2.5000 mg | INHALATION_SOLUTION | Freq: Four times a day (QID) | RESPIRATORY_TRACT | Status: DC | PRN
Start: 2021-11-14 — End: 2021-11-15

## 2021-11-14 MED ORDER — SODIUM CHLORIDE 0.9% FLUSH: 3.0000 mL | INTRAVENOUS | Status: DC | PRN

## 2021-11-14 MED ORDER — METOPROLOL SUCCINATE ER 25 MG PO TB24
25.0000 mg | ORAL_TABLET | Freq: Every day | ORAL | Status: DC
  Administered 2021-11-14 – 2021-11-15 (×2): 25 mg via ORAL
  Filled 2021-11-14 (×2): qty 1

## 2021-11-14 MED ORDER — PANTOPRAZOLE SODIUM 40 MG PO TBEC: 40.0000 mg | DELAYED_RELEASE_TABLET | Freq: Every day | ORAL | Status: DC

## 2021-11-14 MED ORDER — SODIUM CHLORIDE 0.9 % IV SOLN
250.0000 mL | INTRAVENOUS | Status: DC | PRN
Start: 2021-11-14 — End: 2021-11-15

## 2021-11-14 MED ORDER — FENTANYL CITRATE (PF) 100 MCG/2ML IJ SOLN
INTRAMUSCULAR | Status: AC
  Filled 2021-11-14: qty 2

## 2021-11-14 MED ORDER — LISINOPRIL 20 MG PO TABS
20.0000 mg | ORAL_TABLET | Freq: Every day | ORAL | Status: DC
  Administered 2021-11-14 – 2021-11-15 (×2): 20 mg via ORAL
  Filled 2021-11-14 (×2): qty 1

## 2021-11-14 MED ORDER — HEPARIN SODIUM (PORCINE) 5000 UNIT/ML IJ SOLN
5000.0000 [IU] | Freq: Three times a day (TID) | INTRAMUSCULAR | Status: DC
  Administered 2021-11-14 – 2021-11-15 (×2): 5000 [IU] via SUBCUTANEOUS
  Filled 2021-11-14 (×2): qty 1

## 2021-11-14 MED ORDER — LORAZEPAM 2 MG/ML IJ SOLN: 1.0000 mg | Freq: Once | INTRAMUSCULAR | Status: AC

## 2021-11-14 MED ORDER — METOPROLOL TARTRATE 5 MG/5ML IV SOLN: 2.0000 mg | INTRAVENOUS | Status: DC | PRN

## 2021-11-14 MED ORDER — LIDOCAINE HCL (PF) 1 % IJ SOLN
INTRAMUSCULAR | Status: AC
  Filled 2021-11-14: qty 30

## 2021-11-14 MED ORDER — LABETALOL HCL 5 MG/ML IV SOLN
10.0000 mg | INTRAVENOUS | Status: DC | PRN
  Administered 2021-11-14: 10 mg via INTRAVENOUS
  Filled 2021-11-14: qty 4

## 2021-11-14 MED ORDER — LABETALOL HCL 5 MG/ML IV SOLN: 10.0000 mg | INTRAVENOUS | Status: DC | PRN

## 2021-11-14 MED ORDER — IODIXANOL 320 MG/ML IV SOLN
INTRAVENOUS | Status: DC | PRN
  Administered 2021-11-14: 77 mL

## 2021-11-14 MED ORDER — NICOTINE 21 MG/24HR TD PT24
21.0000 mg | MEDICATED_PATCH | Freq: Every day | TRANSDERMAL | Status: DC
  Administered 2021-11-14: 21 mg via TRANSDERMAL
  Filled 2021-11-14 (×2): qty 1

## 2021-11-14 MED ORDER — SODIUM CHLORIDE 0.9 % WEIGHT BASED INFUSION: 1.0000 mL/kg/h | INTRAVENOUS | Status: DC

## 2021-11-14 MED ORDER — ASPIRIN 81 MG PO TBEC
81.0000 mg | DELAYED_RELEASE_TABLET | Freq: Every day | ORAL | Status: DC
  Administered 2021-11-14 – 2021-11-15 (×2): 81 mg via ORAL
  Filled 2021-11-14 (×2): qty 1

## 2021-11-14 MED ORDER — TRAZODONE HCL 50 MG PO TABS
50.0000 mg | ORAL_TABLET | Freq: Every evening | ORAL | Status: DC | PRN
  Administered 2021-11-15: 50 mg via ORAL
  Filled 2021-11-14: qty 1

## 2021-11-14 MED ORDER — MIRTAZAPINE 15 MG PO TABS: 15.0000 mg | ORAL_TABLET | Freq: Every day | ORAL | Status: DC | PRN

## 2021-11-14 MED ORDER — POTASSIUM CHLORIDE IN NACL 20-0.9 MEQ/L-% IV SOLN
INTRAVENOUS | Status: DC
  Filled 2021-11-14: qty 1000

## 2021-11-14 MED ORDER — OXYCODONE HCL 5 MG PO TABS: 5.0000 mg | ORAL_TABLET | ORAL | Status: DC | PRN

## 2021-11-14 MED ORDER — LIDOCAINE HCL (PF) 1 % IJ SOLN
INTRAMUSCULAR | Status: DC | PRN
  Administered 2021-11-14: 15 mL

## 2021-11-14 MED ORDER — MIDAZOLAM HCL 2 MG/2ML IJ SOLN
INTRAMUSCULAR | Status: DC | PRN
  Administered 2021-11-14: 1 mg via INTRAVENOUS

## 2021-11-14 MED ORDER — GUAIFENESIN-DM 100-10 MG/5ML PO SYRP: 15.0000 mL | ORAL_SOLUTION | ORAL | Status: DC | PRN

## 2021-11-14 MED ORDER — ONDANSETRON HCL 4 MG/2ML IJ SOLN: 4.0000 mg | Freq: Four times a day (QID) | INTRAMUSCULAR | Status: DC | PRN

## 2021-11-14 MED ORDER — DOXEPIN HCL 10 MG PO CAPS
10.0000 mg | ORAL_CAPSULE | Freq: Every day | ORAL | Status: DC
  Administered 2021-11-14: 10 mg via ORAL
  Filled 2021-11-14: qty 1

## 2021-11-14 MED ORDER — HYDRALAZINE HCL 20 MG/ML IJ SOLN: 5.0000 mg | INTRAMUSCULAR | Status: DC | PRN

## 2021-11-14 MED ORDER — HEPARIN (PORCINE) IN NACL 1000-0.9 UT/500ML-% IV SOLN
INTRAVENOUS | Status: AC
  Filled 2021-11-14: qty 1000

## 2021-11-14 MED ORDER — MIDAZOLAM HCL 2 MG/2ML IJ SOLN
INTRAMUSCULAR | Status: AC
  Filled 2021-11-14: qty 2

## 2021-11-14 MED ORDER — NALTREXONE HCL 50 MG PO TABS
50.0000 mg | ORAL_TABLET | Freq: Every day | ORAL | Status: DC
  Administered 2021-11-15: 50 mg via ORAL
  Filled 2021-11-14: qty 1

## 2021-11-14 MED ORDER — SPIRITUS FRUMENTI
1.0000 | Freq: Four times a day (QID) | ORAL | Status: DC
  Administered 2021-11-14 – 2021-11-15 (×3): 1 via ORAL
  Filled 2021-11-14 (×4): qty 1

## 2021-11-14 MED ORDER — ONDANSETRON HCL 4 MG/2ML IJ SOLN
4.0000 mg | Freq: Four times a day (QID) | INTRAMUSCULAR | Status: DC | PRN
Start: 2021-11-14 — End: 2021-11-15

## 2021-11-14 MED ORDER — ALUM & MAG HYDROXIDE-SIMETH 200-200-20 MG/5ML PO SUSP: 15.0000 mL | ORAL | Status: DC | PRN

## 2021-11-14 MED ORDER — FENTANYL CITRATE (PF) 100 MCG/2ML IJ SOLN
INTRAMUSCULAR | Status: DC | PRN
Start: 2021-11-14 — End: 2021-11-14
  Administered 2021-11-14: 50 ug via INTRAVENOUS

## 2021-11-14 MED ORDER — HEPARIN (PORCINE) IN NACL 1000-0.9 UT/500ML-% IV SOLN
INTRAVENOUS | Status: DC | PRN
  Administered 2021-11-14 (×2): 500 mL

## 2021-11-14 MED ORDER — PHENOL 1.4 % MT LIQD
1.0000 | OROMUCOSAL | Status: DC | PRN
Start: 2021-11-14 — End: 2021-11-15

## 2021-11-14 MED ORDER — PANTOPRAZOLE SODIUM 40 MG PO TBEC
40.0000 mg | DELAYED_RELEASE_TABLET | Freq: Every day | ORAL | Status: DC
  Administered 2021-11-14 – 2021-11-15 (×2): 40 mg via ORAL
  Filled 2021-11-14 (×2): qty 1

## 2021-11-14 MED ORDER — LORAZEPAM 2 MG/ML IJ SOLN
INTRAMUSCULAR | Status: AC
  Administered 2021-11-14: 1 mg via INTRAVENOUS
  Filled 2021-11-14: qty 1

## 2021-11-14 MED ORDER — AMLODIPINE BESYLATE 5 MG PO TABS
5.0000 mg | ORAL_TABLET | Freq: Every evening | ORAL | Status: DC
  Administered 2021-11-14: 5 mg via ORAL
  Filled 2021-11-14: qty 1

## 2021-11-14 MED ORDER — ATORVASTATIN CALCIUM 40 MG PO TABS
40.0000 mg | ORAL_TABLET | Freq: Every day | ORAL | Status: DC
  Administered 2021-11-15: 40 mg via ORAL
  Filled 2021-11-14: qty 1

## 2021-11-14 MED ORDER — ACETAMINOPHEN 325 MG PO TABS
650.0000 mg | ORAL_TABLET | ORAL | Status: DC | PRN
  Administered 2021-11-14: 650 mg via ORAL
  Filled 2021-11-14: qty 2

## 2021-11-14 SURGICAL SUPPLY — 9 items
CATH OMNI FLUSH 5F 65CM (CATHETERS) ×1 IMPLANT
KIT MICROPUNCTURE NIT STIFF (SHEATH) ×1 IMPLANT
KIT PV (KITS) ×2 IMPLANT
SHEATH PINNACLE 5F 10CM (SHEATH) ×1 IMPLANT
SHEATH PROBE COVER 6X72 (BAG) ×1 IMPLANT
SYR MEDRAD MARK V 150ML (SYRINGE) ×1 IMPLANT
TRANSDUCER W/STOPCOCK (MISCELLANEOUS) ×2 IMPLANT
TRAY PV CATH (CUSTOM PROCEDURE TRAY) ×2 IMPLANT
WIRE BENTSON .035X145CM (WIRE) ×1 IMPLANT

## 2021-11-14 NOTE — H&P (Signed)
HPI:   Ricardo [name] is a 61 y.o. male with history of coronary artery disease and history of intervention.  He does not take any aspirin or other antiplatelet agents but does take a statin.  He has very short distance less than 50 yards claudication which is life limiting for him.  He denies any tissue loss or ulceration.  He does not have any issue with impotence.  No previous vascular intervention.  He is a current every day smoker.       Past Medical History:  Diagnosis Date   Coronary artery disease     Hyperlipidemia     Hypertension           Family History  Problem Relation Age of Onset   Alcoholism Father           Past Surgical History:  Procedure Laterality Date   CORONARY ANGIOPLASTY WITH STENT PLACEMENT          Short Social History:  Social History         Tobacco Use   Smoking status: Every Day      Packs/day: 1.00      Types: Cigarettes   Smokeless tobacco: Never  Substance Use Topics   Alcohol use: Yes          Allergies  Allergen Reactions   Bee Venom Anaphylaxis            Current Outpatient Medications  Medication Sig Dispense Refill   acetaminophen (TYLENOL) 325 MG tablet Take 2 tablets (650 mg total) by mouth every 6 (six) hours as needed for mild pain, fever or headache.       atorvastatin (LIPITOR) 20 MG tablet Take 1 tablet (20 mg total) by mouth daily. For high cholesterol       cholecalciferol (VITAMIN D) 1000 units tablet Take 2 tablets (2,000 Units total) by mouth daily. For bone health       docusate sodium (COLACE) 100 MG capsule Take 1 capsule (100 mg total) by mouth 2 (two) times daily. (may buy from over the counter at the pharmacy): For constipation 1 capsule 0   FLUoxetine (PROZAC) 10 MG capsule Take 1 capsule (10 mg total) by mouth daily. For depression 30 capsule 0   gabapentin (NEURONTIN) 300 MG capsule Take 1 capsule (300 mg total) by mouth 3 (three) times daily. For agitation 90 capsule 0   hydrOXYzine (ATARAX/VISTARIL)  50 MG tablet Take 1 tablet (25 mg) four times daily as needed: For anxiety 60 tablet 0   lisinopril (PRINIVIL,ZESTRIL) 20 MG tablet Take 1 tablet (20 mg total) by mouth daily. For high blood pressure       polyethylene glycol (MIRALAX / GLYCOLAX) packet Take 17 g by mouth daily as needed for mild constipation. (May buy from over the counter): For constipation 14 each 0   thiamine 100 MG tablet Take 1 tablet (100 mg total) by mouth daily. For low thiamine 30 tablet 0   traZODone (DESYREL) 100 MG tablet Take 2 tablets (200 mg total) by mouth at bedtime. For sleep 30 tablet 0    No current facility-administered medications for this visit.      Review of Systems  Constitutional:  Constitutional negative. HENT: HENT negative.  Eyes: Eyes negative.  Cardiovascular: Positive for claudication.  GI: Gastrointestinal negative.  Musculoskeletal: Positive for leg pain.  Neurological: Neurological negative. Hematologic: Hematologic/lymphatic negative.  Psychiatric: Psychiatric negative.          Objective:  Objective [] Expand by Default    Vitals:    10/12/21 1427  BP: (!) 160/97  Pulse: 72  Resp: 20  Temp: 98.1 F (36.7 C)  SpO2: 96%  Weight: 153 lb 12.8 oz (69.8 kg)  Height: 5\' 9"  (1.753 m)    Body mass index is 22.71 kg/m.   Physical Exam HENT:     Head: Normocephalic.  Eyes:     Pupils: Pupils are equal, round, and reactive to light.  Neck:     Vascular: No carotid bruit.  Cardiovascular:     Rate and Rhythm: Normal rate.     Pulses:          Femoral pulses are 0 on the right side and 2+ on the left side.      Popliteal pulses are 0 on the right side and 0 on the left side.     Heart sounds: Normal heart sounds.  Pulmonary:     Effort: Pulmonary effort is normal.  Abdominal:     General: Abdomen is flat.     Palpations: Abdomen is soft.  Musculoskeletal:        General: Normal range of motion.     Cervical back: Neck supple.     Right lower leg: No edema.      Left lower leg: No edema.  Skin:    General: Skin is warm and dry.     Capillary Refill: Capillary refill takes 2 to 3 seconds.  Neurological:     General: No focal deficit present.     Mental Status: He is alert.  Psychiatric:        Mood and Affect: Mood normal.        Behavior: Behavior normal.        Thought Content: Thought content normal.        Judgment: Judgment normal.        Data: ABI Findings:  +---------+------------------+-----+----------+--------+  Right    Rt Pressure (mmHg)IndexWaveform  Comment   +---------+------------------+-----+----------+--------+  Brachial 173                                        +---------+------------------+-----+----------+--------+  PTA                             absent              +---------+------------------+-----+----------+--------+  PERO     86                0.50 monophasic          +---------+------------------+-----+----------+--------+  DP       107               0.62 monophasic          +---------+------------------+-----+----------+--------+  Great Toe82                0.47 Abnormal            +---------+------------------+-----+----------+--------+   +---------+------------------+-----+----------+-------+  Left     Lt Pressure (mmHg)IndexWaveform  Comment  +---------+------------------+-----+----------+-------+  Brachial 173                                       +---------+------------------+-----+----------+-------+  PTA      147  0.85 monophasicbrisk    +---------+------------------+-----+----------+-------+  PERO     130               0.75 biphasic           +---------+------------------+-----+----------+-------+  DP       137               0.79 biphasic           +---------+------------------+-----+----------+-------+  Great Toe134               0.77 Normal             +---------+------------------+-----+----------+-------+    +-------+-----------+-----------+------------+------------+  ABI/TBIToday's ABIToday's TBIPrevious ABIPrevious TBI  +-------+-----------+-----------+------------+------------+  Right  .62        .47                                  +-------+-----------+-----------+------------+------------+  Left   .85        .77                                  +-------+-----------+-----------+------------+------------+           Assessment/Plan:    62 year old male with right lower extremity very short distance claudication with evidence of inflow disease on the right with a strongly palpable left common femoral pulse.  Plan will be for angiography from the left common femoral approach.  We have discussed smoking cessation and he demonstrates good understanding.  He is now taking aspirin and a statin.         Maeola Harman MD [facility]

## 2021-11-14 NOTE — Op Note (Signed)
    Patient name: Ricardo [name] MRN: [id number] DOB: 02/25/1961 Sex: male  11/14/2021 Pre-operative Diagnosis: Right lower extremity life-style limiting claudication Post-operative diagnosis:  Same Surgeon:  Apolinar Junes C. Randie Heinz, MD Procedure Performed: 1.  Ultrasound-guided cannulation left common femoral artery 2.  Aortogram 3.  Limited bilateral lower extremity angiography 4.  Moderate sedation with fentanyl and Versed for 16 minutes  Indications: 61 year old male with a history of lifestyle limiting claudication in the right lower extremity without right common femoral pulse.  He is now indicated for angiography from the left common femoral approach.  Findings: Aorta is free of flow-limiting stenosis bilateral renal arteries are patent.  Left common iliac artery is patent left external neck artery has at least 50% stenosis in the common femoral artery where similarly diseased and then has a patent SFA and profunda artery on the left.  On the right side the common iliac artery is occluded for a long segment as is the external neck artery there is no evidence of hypogastric artery reconstitutes the distal common femoral artery with SFA and profunda femoris artery.  I do not think patient will be a good candidate given his young age and continued smoking status for left external iliac artery stent followed by femorofemoral bypass.  His best option would be for aortobifemoral bypass given bilateral disease I will plan to see him back and we again discussed the importance of smoking cessation.   Procedure:  The patient was identified in the holding area and taken to room 8.  The patient was then placed supine on the table and prepped and draped in the usual sterile fashion.  A time out was called.  Ultrasound was used to evaluate the left common femoral artery.  This was somewhat diseased.  The area was anesthetized with 1% lidocaine cannulated with micropuncture needle followed by wire and sheath.  And images  saved in permanent record.  Concomitantly we administered fentanyl and Versed monitored his vital signs throughout the case.  Bentson wires placed followed by 5 French sheath and Omni catheter was placed to the level of L1 and aortogram was performed followed pelvic angiography followed by limited bilateral lower extremity angiography given the delay in contrast on the right side.  With this we remove the catheter over wire elected not to place the closure device given possible need for surgery in the near future as well as disease in the left common femoral artery.  He tolerated the procedure well without any complication.  Contrast: 85cc  [name], MD [facility] Office: [phone] Pager: [phone]

## 2021-11-14 NOTE — Progress Notes (Signed)
Patients Case Manager Camelia Eng was called to inform her that patient was back in Short Stay since she is here at the hospital with him and will be transporting him home. Patients sister Burna Mortimer who is also POA, was contacted by Terri to let her know that the patient would be discharged at 1715. Patients sister stated that she will not be staying with him tonight, so now patient has no one to stay overnight with him. Terri, CM stated that he has no one else. Randie Heinz, MD notified. Peter Minium stated that she would let MD know and call back.

## 2021-11-14 NOTE — Progress Notes (Signed)
Site area: left groin  Site Prior to Removal:  Level 0 Pressure Applied For: 20 minutes Manual:   yes Patient Status During Pull:  stable Post Pull Site:  Level 0 Post Pull Instructions Given:  yes Post Pull Pulses Present: left dp palpable Dressing Applied:  gauze and tegaderm Bedrest begins @ 1250 Comments:

## 2021-11-15 ENCOUNTER — Encounter (HOSPITAL_COMMUNITY): Payer: Self-pay | Admitting: Vascular Surgery

## 2021-11-15 DIAGNOSIS — I70211 Atherosclerosis of native arteries of extremities with intermittent claudication, right leg: Secondary | ICD-10-CM | POA: Diagnosis not present

## 2021-11-15 LAB — BASIC METABOLIC PANEL
Anion gap: 7 (ref 5–15)
BUN: 19 mg/dL (ref 6–20)
CO2: 23 mmol/L (ref 22–32)
Calcium: 8.1 mg/dL — ABNORMAL LOW (ref 8.9–10.3)
Chloride: 112 mmol/L — ABNORMAL HIGH (ref 98–111)
Creatinine, Ser: 1.21 mg/dL (ref 0.61–1.24)
GFR, Estimated: >60 mL/min (ref >60)
Glucose, Bld: 110 mg/dL — ABNORMAL HIGH (ref 70–99)
Potassium: 3.4 mmol/L — ABNORMAL LOW (ref 3.5–5.1)
Sodium: 142 mmol/L (ref 135–145)

## 2021-11-15 MED ORDER — ASPIRIN 81 MG PO TBEC
81.0000 mg | DELAYED_RELEASE_TABLET | Freq: Every day | ORAL | 12 refills | Status: AC
Start: 2021-11-15 — End: ?

## 2021-11-15 NOTE — Progress Notes (Signed)
  Progress Note    11/15/2021 7:46 AM 1 Day Post-Op  Subjective:  Patient is pain free, ready to go home    Vitals:   11/14/21 1950 11/15/21 0410  BP: 134/86 130/86  Pulse: 67 70  Resp: 16 18  Temp: (!) 97.5 F (36.4 C) 98 F (36.7 C)  SpO2: 97% 99%    Physical Exam: Lungs:  nonlabored Incisions:  L groin cath site c/d/l without hematoma  CBC    Component Value Date/Time   WBC 7.3 11/14/2021 1856   RBC 3.46 (L) 11/14/2021 1856   HGB 12.0 (L) 11/14/2021 1856   HCT 35.1 (L) 11/14/2021 1856   PLT 235 11/14/2021 1856   MCV 101.4 (H) 11/14/2021 1856   MCH 34.7 (H) 11/14/2021 1856   MCHC 34.2 11/14/2021 1856   RDW 15.0 11/14/2021 1856    BMET    Component Value Date/Time   NA 142 11/15/2021 0220   K 3.4 (L) 11/15/2021 0220   CL 112 (H) 11/15/2021 0220   CO2 23 11/15/2021 0220   GLUCOSE 110 (H) 11/15/2021 0220   BUN 19 11/15/2021 0220   CREATININE 1.21 11/15/2021 0220   CALCIUM 8.1 (L) 11/15/2021 0220   GFRNONAA >60 11/15/2021 0220   GFRAA 63 (L) 10/11/2013 1330    INR No results found for: "INR"   Intake/Output Summary (Last 24 hours) at 11/15/2021 0746 Last data filed at 11/15/2021 0500 Gross per 24 hour  Intake 514.92 ml  Output 800 ml  Net -285.08 ml     Assessment/Plan:  61 y.o. male is 1 day post op, s/p: aortogram with bilateral lower extremity angiography   -Patient is pain free -No hematoma at L groin cath site -Will place orders for discharge today. Patient will follow up with Dr.Cain to discuss treatment options in a couple weeks   Loel Dubonnet, [place] [facility] [phone] 11/15/2021 7:46 AM

## 2021-11-15 NOTE — Plan of Care (Signed)
  Problem: Education: Goal: Understanding of CV disease, CV risk reduction, and recovery process will improve Outcome: Not Progressing Goal: Individualized Educational Video(s) Outcome: Not Progressing   Problem: Activity: Goal: Ability to return to baseline activity level will improve Outcome: Not Progressing   Problem: Cardiovascular: Goal: Ability to achieve and maintain adequate cardiovascular perfusion will improve Outcome: Not Progressing Goal: Vascular access site(s) Level 0-1 will be maintained Outcome: Not Progressing   Problem: Health Behavior/Discharge Planning: Goal: Ability to safely manage health-related needs after discharge will improve Outcome: Not Progressing   

## 2021-11-21 NOTE — Discharge Summary (Signed)
Discharge Summary  Patient ID: Ricardo [name] [id number] 61 y.o. [birth year]  Admit date: 11/14/2021  Discharge date and time: 11/15/2021 10:25 AM   Admitting Physician: Maeola Harman, MD   Discharge Physician: Maeola Harman, MD   Admission Diagnoses: PAD (peripheral artery disease) [facility]) [I73.9]  Discharge Diagnoses: PAD (peripheral artery disease) ([facility]) [I73.9]  Admission Condition: fair  Discharged Condition: fair  Indication for Admission: 61 year old male with a history of lifestyle limiting claudication in the right lower extremity without right common femoral pulse.  He is now indicated for angiography from the left common femoral approach.  Hospital Course: Patient underwent diagnostic aortogram with limited bilateral lower extremity angiography on 11/14/2021 with Dr.Cain.  Patient was kept overnight for observation due to transportation issues.  The patient was transferred to the PACU in good condition.  POD 1, the patient is pain-free.  The patient was ambulating well.  His left groin cath site was intact without hematoma.  Patient was discharged on POD 1.  Consults: None  Treatments: anticoagulation: ASA and subcutaneous heparin and procedures: aortogram with limited bilateral lower extremity angiography  Discharge Exam: See progress note  Vitals:   11/14/21 1950 11/15/21 0410  BP: 134/86 130/86  Pulse: 67 70  Resp: 16 18  Temp: (!) 97.5 F (36.4 C) 98 F (36.7 C)  SpO2: 97% 99%     Disposition: Discharge disposition: 01-Home or Self Care       Patient Instructions:  Allergies as of 11/15/2021       Reactions   Bee Venom Anaphylaxis        Medication List     TAKE these medications    albuterol 108 (90 Base) MCG/ACT inhaler Commonly known as: VENTOLIN HFA Inhale 1-2 puffs into the lungs every 6 (six) hours as needed for wheezing or shortness of breath.   amLODipine 5 MG tablet Commonly known as: NORVASC Take 5 mg by  mouth every evening.   aspirin EC 81 MG tablet Take 1 tablet (81 mg total) by mouth daily. Swallow whole.   atorvastatin 40 MG tablet Commonly known as: LIPITOR Take 40 mg by mouth in the morning.   doxepin 10 MG capsule Commonly known as: SINEQUAN Take 10 mg by mouth at bedtime.   esomeprazole 40 MG capsule Commonly known as: NEXIUM Take 40 mg by mouth daily before breakfast.   lisinopril 20 MG tablet Commonly known as: ZESTRIL Take 1 tablet (20 mg total) by mouth daily. For high blood pressure   metoprolol succinate 25 MG 24 hr tablet Commonly known as: TOPROL-XL Take 25 mg by mouth in the morning.   mirtazapine 15 MG tablet Commonly known as: REMERON Take 15 mg by mouth daily as needed (Sleep).   naltrexone 50 MG tablet Commonly known as: DEPADE Take 50 mg by mouth daily.   rOPINIRole 0.25 MG tablet Commonly known as: REQUIP Take 0.25 mg by mouth at bedtime as needed for sleep.               Discharge Care Instructions  (From admission, onward)           Start     Ordered   11/15/21 0000  Discharge wound care:       Comments: Take off gauze with your first shower back at home   11/15/21 0744           Activity: activity as tolerated Diet: cardiac diet Wound Care: keep wound clean and dry  Follow-up with Dr.Cain in  3 weeks.  SignedErnestene Mention, PA-C 11/21/2021 11:02 AM [facility] Office: [phone]

## 2022-01-18 ENCOUNTER — Ambulatory Visit: Payer: Medicaid Other | Admitting: Vascular Surgery

## 2022-03-23 ENCOUNTER — Emergency Department (HOSPITAL_COMMUNITY): Payer: Medicaid Other

## 2022-03-23 ENCOUNTER — Inpatient Hospital Stay (HOSPITAL_COMMUNITY)
Admission: EM | Admit: 2022-03-23 | Discharge: 2022-03-25 | DRG: 176 | Disposition: A | Discharge status: Home / Self Care | Payer: Medicaid Other | Attending: Student in an Organized Health Care Education/Training Program | Admitting: Student in an Organized Health Care Education/Training Program

## 2022-03-23 ENCOUNTER — Emergency Department (HOSPITAL_BASED_OUTPATIENT_CLINIC_OR_DEPARTMENT_OTHER): Payer: Medicaid Other

## 2022-03-23 DIAGNOSIS — Z79899 Other long term (current) drug therapy: Secondary | ICD-10-CM

## 2022-03-23 DIAGNOSIS — G4733 Obstructive sleep apnea (adult) (pediatric): Secondary | ICD-10-CM

## 2022-03-23 DIAGNOSIS — I2510 Atherosclerotic heart disease of native coronary artery without angina pectoris: Secondary | ICD-10-CM | POA: Diagnosis present

## 2022-03-23 DIAGNOSIS — I82411 Acute embolism and thrombosis of right femoral vein: Secondary | ICD-10-CM | POA: Diagnosis present

## 2022-03-23 DIAGNOSIS — J439 Emphysema, unspecified: Secondary | ICD-10-CM | POA: Diagnosis present

## 2022-03-23 DIAGNOSIS — Z955 Presence of coronary angioplasty implant and graft: Secondary | ICD-10-CM

## 2022-03-23 DIAGNOSIS — R079 Chest pain, unspecified: Secondary | ICD-10-CM

## 2022-03-23 DIAGNOSIS — I2699 Other pulmonary embolism without acute cor pulmonale: Secondary | ICD-10-CM | POA: Diagnosis not present

## 2022-03-23 DIAGNOSIS — E876 Hypokalemia: Secondary | ICD-10-CM | POA: Insufficient documentation

## 2022-03-23 DIAGNOSIS — Z91030 Bee allergy status: Secondary | ICD-10-CM

## 2022-03-23 DIAGNOSIS — I82409 Acute embolism and thrombosis of unspecified deep veins of unspecified lower extremity: Secondary | ICD-10-CM | POA: Insufficient documentation

## 2022-03-23 DIAGNOSIS — I824Z1 Acute embolism and thrombosis of unspecified deep veins of right distal lower extremity: Secondary | ICD-10-CM

## 2022-03-23 DIAGNOSIS — I739 Peripheral vascular disease, unspecified: Secondary | ICD-10-CM | POA: Diagnosis present

## 2022-03-23 DIAGNOSIS — F32A Depression, unspecified: Secondary | ICD-10-CM | POA: Diagnosis present

## 2022-03-23 DIAGNOSIS — R0602 Shortness of breath: Secondary | ICD-10-CM | POA: Diagnosis not present

## 2022-03-23 DIAGNOSIS — Z6372 Alcoholism and drug addiction in family: Secondary | ICD-10-CM

## 2022-03-23 DIAGNOSIS — Z811 Family history of alcohol abuse and dependence: Secondary | ICD-10-CM

## 2022-03-23 DIAGNOSIS — I10 Essential (primary) hypertension: Secondary | ICD-10-CM | POA: Diagnosis present

## 2022-03-23 DIAGNOSIS — Z7982 Long term (current) use of aspirin: Secondary | ICD-10-CM

## 2022-03-23 DIAGNOSIS — F1020 Alcohol dependence, uncomplicated: Secondary | ICD-10-CM | POA: Diagnosis present

## 2022-03-23 DIAGNOSIS — F17210 Nicotine dependence, cigarettes, uncomplicated: Secondary | ICD-10-CM | POA: Diagnosis present

## 2022-03-23 DIAGNOSIS — I2694 Multiple subsegmental pulmonary emboli without acute cor pulmonale: Principal | ICD-10-CM

## 2022-03-23 DIAGNOSIS — N179 Acute kidney failure, unspecified: Secondary | ICD-10-CM

## 2022-03-23 DIAGNOSIS — K760 Fatty (change of) liver, not elsewhere classified: Secondary | ICD-10-CM | POA: Diagnosis present

## 2022-03-23 DIAGNOSIS — J449 Chronic obstructive pulmonary disease, unspecified: Secondary | ICD-10-CM | POA: Diagnosis not present

## 2022-03-23 DIAGNOSIS — E785 Hyperlipidemia, unspecified: Secondary | ICD-10-CM | POA: Diagnosis present

## 2022-03-23 LAB — MAGNESIUM: Magnesium: 1.1 mg/dL — ABNORMAL LOW (ref 1.7–2.4)

## 2022-03-23 LAB — CBC WITH DIFFERENTIAL/PLATELET
Abs Immature Granulocytes: 0.07 10*3/uL (ref 0.00–0.07)
Basophils Absolute: 0.1 10*3/uL (ref 0.0–0.1)
Basophils Relative: 0 %
Eosinophils Absolute: 0.1 10*3/uL (ref 0.0–0.5)
Eosinophils Relative: 1 %
HCT: 38.9 % — ABNORMAL LOW (ref 39.0–52.0)
Hemoglobin: 13.4 g/dL (ref 13.0–17.0)
Immature Granulocytes: 1 %
Lymphocytes Relative: 16 %
Lymphs Abs: 2.2 10*3/uL (ref 0.7–4.0)
MCH: 34.5 pg — ABNORMAL HIGH (ref 26.0–34.0)
MCHC: 34.4 g/dL (ref 30.0–36.0)
MCV: 100.3 fL — ABNORMAL HIGH (ref 80.0–100.0)
Monocytes Absolute: 1.3 10*3/uL — ABNORMAL HIGH (ref 0.1–1.0)
Monocytes Relative: 10 %
Neutro Abs: 9.8 10*3/uL — ABNORMAL HIGH (ref 1.7–7.7)
Neutrophils Relative %: 72 %
Platelets: 372 10*3/uL (ref 150–400)
RBC: 3.88 MIL/uL — ABNORMAL LOW (ref 4.22–5.81)
RDW: 13.5 % (ref 11.5–15.5)
WBC: 13.6 10*3/uL — ABNORMAL HIGH (ref 4.0–10.5)
nRBC: 0 % (ref 0.0–0.2)

## 2022-03-23 LAB — BASIC METABOLIC PANEL
Anion gap: 13 (ref 5–15)
BUN: 6 mg/dL — ABNORMAL LOW (ref 8–23)
CO2: 27 mmol/L (ref 22–32)
Calcium: 9 mg/dL (ref 8.9–10.3)
Chloride: 99 mmol/L (ref 98–111)
Creatinine, Ser: 0.9 mg/dL (ref 0.61–1.24)
GFR, Estimated: >60 mL/min (ref >60)
Glucose, Bld: 111 mg/dL — ABNORMAL HIGH (ref 70–99)
Potassium: 2.4 mmol/L — CL (ref 3.5–5.1)
Sodium: 139 mmol/L (ref 135–145)

## 2022-03-23 LAB — TROPONIN I (HIGH SENSITIVITY)
Troponin I (High Sensitivity): 22 ng/L — ABNORMAL HIGH (ref <18)
Troponin I (High Sensitivity): 20 ng/L — ABNORMAL HIGH (ref <18)

## 2022-03-23 LAB — BRAIN NATRIURETIC PEPTIDE: B Natriuretic Peptide: 45.1 pg/mL (ref 0.0–100.0)

## 2022-03-23 MED ORDER — MIRTAZAPINE 15 MG PO TABS
15.0000 mg | ORAL_TABLET | Freq: Every day | ORAL | Status: DC | PRN
  Administered 2022-03-23: 15 mg via ORAL
  Filled 2022-03-23: qty 1

## 2022-03-23 MED ORDER — HEPARIN (PORCINE) 25000 UT/250ML-% IV SOLN
1500.0000 [IU]/h | INTRAVENOUS | Status: DC
  Administered 2022-03-23: 1100 [IU]/h via INTRAVENOUS
  Administered 2022-03-24: 1500 [IU]/h via INTRAVENOUS
  Filled 2022-03-23 (×2): qty 250

## 2022-03-23 MED ORDER — FOLIC ACID 1 MG PO TABS
1.0000 mg | ORAL_TABLET | Freq: Every day | ORAL | Status: DC
  Administered 2022-03-23 – 2022-03-25 (×3): 1 mg via ORAL
  Filled 2022-03-23 (×3): qty 1

## 2022-03-23 MED ORDER — LISINOPRIL 20 MG PO TABS
20.0000 mg | ORAL_TABLET | Freq: Every day | ORAL | Status: DC
  Administered 2022-03-24 – 2022-03-25 (×2): 20 mg via ORAL
  Filled 2022-03-23 (×2): qty 1

## 2022-03-23 MED ORDER — THIAMINE HCL 100 MG/ML IJ SOLN
100.0000 mg | Freq: Every day | INTRAMUSCULAR | Status: DC
  Filled 2022-03-23: qty 2

## 2022-03-23 MED ORDER — LORAZEPAM 2 MG/ML IJ SOLN: 1.0000 mg | INTRAMUSCULAR | Status: DC | PRN

## 2022-03-23 MED ORDER — MORPHINE SULFATE (PF) 4 MG/ML IV SOLN
4.0000 mg | Freq: Once | INTRAVENOUS | Status: AC
  Administered 2022-03-23: 4 mg via INTRAVENOUS
  Filled 2022-03-23: qty 1

## 2022-03-23 MED ORDER — ACETAMINOPHEN 325 MG PO TABS
650.0000 mg | ORAL_TABLET | Freq: Four times a day (QID) | ORAL | Status: DC | PRN
  Administered 2022-03-23: 650 mg via ORAL
  Filled 2022-03-23: qty 2

## 2022-03-23 MED ORDER — ADULT MULTIVITAMIN W/MINERALS CH
1.0000 | ORAL_TABLET | Freq: Every day | ORAL | Status: DC
  Administered 2022-03-23 – 2022-03-25 (×3): 1 via ORAL
  Filled 2022-03-23 (×3): qty 1

## 2022-03-23 MED ORDER — ATORVASTATIN CALCIUM 40 MG PO TABS
40.0000 mg | ORAL_TABLET | Freq: Every morning | ORAL | Status: DC
  Administered 2022-03-24: 40 mg via ORAL
  Filled 2022-03-23: qty 1

## 2022-03-23 MED ORDER — METOPROLOL SUCCINATE ER 25 MG PO TB24
25.0000 mg | ORAL_TABLET | Freq: Every morning | ORAL | Status: DC
  Administered 2022-03-24 – 2022-03-25 (×2): 25 mg via ORAL
  Filled 2022-03-23 (×2): qty 1

## 2022-03-23 MED ORDER — ALBUTEROL SULFATE (2.5 MG/3ML) 0.083% IN NEBU: 2.5000 mg | INHALATION_SOLUTION | Freq: Four times a day (QID) | RESPIRATORY_TRACT | Status: DC | PRN

## 2022-03-23 MED ORDER — ASPIRIN 81 MG PO TBEC
81.0000 mg | DELAYED_RELEASE_TABLET | Freq: Every day | ORAL | Status: DC
  Administered 2022-03-24 – 2022-03-25 (×2): 81 mg via ORAL
  Filled 2022-03-23 (×2): qty 1

## 2022-03-23 MED ORDER — THIAMINE MONONITRATE 100 MG PO TABS
100.0000 mg | ORAL_TABLET | Freq: Every day | ORAL | Status: DC
  Administered 2022-03-23 – 2022-03-25 (×3): 100 mg via ORAL
  Filled 2022-03-23 (×4): qty 1

## 2022-03-23 MED ORDER — OXYCODONE-ACETAMINOPHEN 5-325 MG PO TABS
1.0000 | ORAL_TABLET | Freq: Once | ORAL | Status: AC
  Administered 2022-03-23: 1 via ORAL
  Filled 2022-03-23: qty 1

## 2022-03-23 MED ORDER — PANTOPRAZOLE SODIUM 40 MG PO TBEC
40.0000 mg | DELAYED_RELEASE_TABLET | Freq: Every day | ORAL | Status: DC
  Administered 2022-03-24 – 2022-03-25 (×2): 40 mg via ORAL
  Filled 2022-03-23 (×2): qty 1

## 2022-03-23 MED ORDER — POTASSIUM CHLORIDE 10 MEQ/100ML IV SOLN
10.0000 meq | INTRAVENOUS | Status: AC
  Administered 2022-03-23 (×3): 10 meq via INTRAVENOUS
  Filled 2022-03-23 (×3): qty 100

## 2022-03-23 MED ORDER — ALBUTEROL SULFATE HFA 108 (90 BASE) MCG/ACT IN AERS: 1.0000 | INHALATION_SPRAY | Freq: Four times a day (QID) | RESPIRATORY_TRACT | Status: DC | PRN

## 2022-03-23 MED ORDER — HEPARIN BOLUS VIA INFUSION
4000.0000 [IU] | Freq: Once | INTRAVENOUS | Status: AC
  Administered 2022-03-23: 4000 [IU] via INTRAVENOUS
  Filled 2022-03-23: qty 4000

## 2022-03-23 MED ORDER — AMLODIPINE BESYLATE 5 MG PO TABS
5.0000 mg | ORAL_TABLET | Freq: Every evening | ORAL | Status: DC
  Administered 2022-03-23 – 2022-03-24 (×2): 5 mg via ORAL
  Filled 2022-03-23 (×2): qty 1

## 2022-03-23 MED ORDER — ACETAMINOPHEN 650 MG RE SUPP: 650.0000 mg | Freq: Four times a day (QID) | RECTAL | Status: DC | PRN

## 2022-03-23 MED ORDER — POLYETHYLENE GLYCOL 3350 17 G PO PACK: 17.0000 g | PACK | Freq: Every day | ORAL | Status: DC | PRN

## 2022-03-23 MED ORDER — SODIUM CHLORIDE 0.9% FLUSH
3.0000 mL | Freq: Two times a day (BID) | INTRAVENOUS | Status: DC
  Administered 2022-03-24: 3 mL via INTRAVENOUS

## 2022-03-23 MED ORDER — LORAZEPAM 1 MG PO TABS
1.0000 mg | ORAL_TABLET | ORAL | Status: DC | PRN
  Administered 2022-03-24: 1 mg via ORAL
  Filled 2022-03-23: qty 1

## 2022-03-23 MED ORDER — IOHEXOL 350 MG/ML SOLN
75.0000 mL | Freq: Once | INTRAVENOUS | Status: AC | PRN
  Administered 2022-03-23: 75 mL via INTRAVENOUS

## 2022-03-23 MED ORDER — POTASSIUM CHLORIDE CRYS ER 20 MEQ PO TBCR
40.0000 meq | EXTENDED_RELEASE_TABLET | Freq: Once | ORAL | Status: AC
  Administered 2022-03-23: 40 meq via ORAL
  Filled 2022-03-23: qty 2

## 2022-03-23 NOTE — H&P (Signed)
History and Physical   Ricardo [name] RKY:[id number] DOB: 03-19-1961 DOA: 03/23/2022  PCP: [facility]   Patient coming from: Home/PCP office  Chief Complaint: Right leg pain, DVT  HPI: Ricardo [name] is a 61 y.o. male with medical history significant of hypertension, hyperlipidemia, PAD, CAD, depression, COPD, OSA, alcohol use presenting with right lower extremity pain with outpatient diagnosed DVT.  Patient reports 2 to 3 days of right lower extremity pain and today began to have some right-sided chest pain with associated shortness of breath.  He went to be evaluated at his PCP office and ultrasound was performed showing DVT of the right lower extremity and patient was advised to go to the ED for further evaluation.  He denies fevers, chills, abdominal pain, constipation, diarrhea, nausea, vomiting.  He reports he does still drink a pint a day and has not had significant withdrawal/withdrawal seizures that he knows of.  Chart review shows he has been through detox in the past including some hallucinations.  ED Course: Pleasant in the ED significant for heart rate in the 100s to 110s, blood pressure in the 130s to 150s systolic.  Lab workup included BMP with potassium 2.4, glucose 111.  CBC showed leukocytosis to 13.6.  Troponin mildly elevated at 22 and then improved to 20 on repeat.  BNP pending.  Chest x-ray showed no acute abnormality.  CTA PE study showed bilateral upper and lower lobe lobar and segmental pulmonary emboli with an RV to LV ratio of 1.04 indicating at least submassive pulmonary embolism.  Also noted was hepatic steatosis.  Magnesium level is pending.  Patient received 40 mill equivalents of p.o. and 40 mEq IV potassium.  Also received morphine, oxycodone, Ativan, folate, thiamine, multivitamin in the ED.  Started on heparin drip for DVT/PE.  Review of Systems: As per HPI otherwise all other systems reviewed and are negative.  Past Medical  History:  Diagnosis Date   Coronary artery disease    Hyperlipidemia    Hypertension     Past Surgical History:  Procedure Laterality Date   ABDOMINAL AORTOGRAM W/LOWER EXTREMITY Bilateral 11/14/2021   Procedure: ABDOMINAL AORTOGRAM W/LOWER EXTREMITY;  Surgeon: Maeola Harman, MD;  Location: [facility] INVASIVE CV LAB;  Service: Cardiovascular;  Laterality: Bilateral;  Limited Study   CORONARY ANGIOPLASTY WITH STENT PLACEMENT      Social History  reports that he has been smoking cigarettes. He has been smoking an average of 1 pack per day. He has never used smokeless tobacco. He reports current alcohol use. He reports current drug use. Drug: Marijuana.  Allergies  Allergen Reactions   Bee Venom Anaphylaxis    Family History  Problem Relation Age of Onset   Alcoholism Father   Reviewed on admission  Prior to Admission medications   Medication Sig Start Date End Date Taking? Authorizing Provider  albuterol (VENTOLIN HFA) 108 (90 Base) MCG/ACT inhaler Inhale 1-2 puffs into the lungs every 6 (six) hours as needed for wheezing or shortness of breath.    [provider]  amLODipine (NORVASC) 5 MG tablet Take 5 mg by mouth every evening. 06/17/21   [provider]  aspirin EC 81 MG tablet Take 1 tablet (81 mg total) by mouth daily. Swallow whole. 11/15/21   Schuh, McKenzi P, PA-C  atorvastatin (LIPITOR) 40 MG tablet Take 40 mg by mouth in the morning. 08/30/21   [provider]  doxepin (SINEQUAN) 10 MG capsule Take 10 mg by mouth at  bedtime.    [provider]  esomeprazole (NEXIUM) 40 MG capsule Take 40 mg by mouth daily before breakfast. 07/28/21   [provider]  lisinopril (PRINIVIL,ZESTRIL) 20 MG tablet Take 1 tablet (20 mg total) by mouth daily. For high blood pressure 11/30/16   Nwoko, Nicole KindredAgnes I, NP  metoprolol succinate (TOPROL-XL) 25 MG 24 hr tablet Take 25 mg by mouth in the morning. 07/26/21   [provider]  mirtazapine  (REMERON) 15 MG tablet Take 15 mg by mouth daily as needed (Sleep).    [provider]  naltrexone (DEPADE) 50 MG tablet Take 50 mg by mouth daily. 09/13/21   [provider]  rOPINIRole (REQUIP) 0.25 MG tablet Take 0.25 mg by mouth at bedtime as needed for sleep. Patient not taking: Reported on 11/08/2021 06/17/21   [provider]    Physical Exam: Vitals:   03/23/22 1344 03/23/22 1600 03/23/22 1615 03/23/22 1700  BP: (!) 149/94 (!) 159/106 (!) 157/98   Pulse: (!) 118 (!) 110 (!) 105   Resp: 16 12 14    Temp: 98.2 F (36.8 C)     TempSrc: Oral     SpO2: 96% 98% 94%   Weight:    69.8 kg  Height:    5\' 9"  (1.753 m)    Physical Exam Constitutional:      General: He is not in acute distress.    Appearance: Normal appearance.  HENT:     Head: Normocephalic and atraumatic.     Mouth/Throat:     Mouth: Mucous membranes are moist.     Pharynx: Oropharynx is clear.  Eyes:     Extraocular Movements: Extraocular movements intact.     Pupils: Pupils are equal, round, and reactive to light.  Cardiovascular:     Rate and Rhythm: Regular rhythm. Tachycardia present.     Pulses: Normal pulses.     Heart sounds: Normal heart sounds.  Pulmonary:     Effort: Pulmonary effort is normal. No respiratory distress.     Breath sounds: Normal breath sounds.  Abdominal:     General: Bowel sounds are normal. There is no distension.     Palpations: Abdomen is soft.     Tenderness: There is no abdominal tenderness.  Musculoskeletal:        General: No swelling or deformity.     Right lower leg: Edema present.  Skin:    General: Skin is warm and dry.  Neurological:     General: No focal deficit present.     Mental Status: Mental status is at baseline.    Labs on Admission: I have personally reviewed following labs and imaging studies  CBC: Recent Labs  Lab 03/23/22 1200  WBC 13.6*  NEUTROABS 9.8*  HGB 13.4  HCT 38.9*  MCV 100.3*  PLT 372    Basic Metabolic  Panel: Recent Labs  Lab 03/23/22 1200  NA 139  K 2.4*  CL 99  CO2 27  GLUCOSE 111*  BUN 6*  CREATININE 0.90  CALCIUM 9.0    GFR: Estimated Creatinine Clearance: 85.1 mL/min (by C-G formula based on SCr of 0.9 mg/dL).  Liver Function Tests: No results for input(s): "AST", "ALT", "ALKPHOS", "BILITOT", "PROT", "ALBUMIN" in the last 168 hours.  Urine analysis: No results found for: "COLORURINE", "APPEARANCEUR", "LABSPEC", "PHURINE", "GLUCOSEU", "HGBUR", "BILIRUBINUR", "KETONESUR", "PROTEINUR", "UROBILINOGEN", "NITRITE", "LEUKOCYTESUR"  Radiological Exams on Admission: CT Angio Chest PE W/Cm &/Or Wo Cm  Result Date: 03/23/2022 CLINICAL DATA:  Chest pain and  shortness of breath. Right lower extremity DVT. EXAM: CT ANGIOGRAPHY CHEST WITH CONTRAST TECHNIQUE: Multidetector CT imaging of the chest was performed using the standard protocol during bolus administration of intravenous contrast. Multiplanar CT image reconstructions and MIPs were obtained to evaluate the vascular anatomy. RADIATION DOSE REDUCTION: This exam was performed according to the departmental dose-optimization program which includes automated exposure control, adjustment of the mA and/or kV according to patient size and/or use of iterative reconstruction technique. CONTRAST:  75mL OMNIPAQUE IOHEXOL 350 MG/ML SOLN COMPARISON:  CT chest dated March 17, 2022. FINDINGS: Cardiovascular: Satisfactory opacification of the pulmonary arteries to the segmental level. Acute nonocclusive lobar and segmental pulmonary emboli in the bilateral upper and lower lobes. Normal heart size with elevated RV/LV ratio of 1.04. No pericardial effusion. No thoracic aortic aneurysm or dissection. Coronary and aortic arch atherosclerotic vascular disease. Mediastinum/Nodes: No enlarged mediastinal, hilar, or axillary lymph nodes. Thyroid gland, trachea, and esophagus demonstrate no significant findings. Lungs/Pleura: Prominent central peribronchial  thickening. No focal consolidation, pleural effusion, or pneumothorax. Very mild centrilobular emphysema. Upper Abdomen: No acute abnormality. Unchanged diffuse severe hepatic steatosis. Musculoskeletal: No chest wall abnormality. No acute or significant osseous findings. Old healed bilateral rib fractures. Review of the MIP images confirms the above findings. IMPRESSION: 1. Acute nonocclusive lobar and segmental pulmonary emboli in the bilateral upper and lower lobes. Positive for acute PE with CT evidence of right heart strain (RV/LV Ratio = 1.04 ) consistent with at least submassive (intermediate risk) PE. The presence of right heart strain has been associated with an increased risk of morbidity and mortality. Please refer to the "Code PE Focused" order set in EPIC. 2. Unchanged severe hepatic steatosis. 3. Aortic Atherosclerosis (ICD10-I70.0) and Emphysema (ICD10-J43.9). Critical Value/emergent results were called by telephone at the time of interpretation on 03/23/2022 at 5:37 pm to provider [facility], who verbally acknowledged these results. Electronically Signed   By: Obie Dredge M.D.   On: 03/23/2022 17:40   VAS [place] LOWER EXTREMITY VENOUS (DVT) (7a-7p)  Result Date: 03/23/2022  Lower Venous DVT Study Patient Name:  NICKHOLAS GOLDSTON Verdejo  Date of Exam:   03/23/2022 Medical Rec #: [id number]  Accession #:    [id number] Date of Birth: [birth year]   Patient Gender: M Patient Age:   71 years Exam Location:  [facility] Procedure:      VAS [place] LOWER EXTREMITY VENOUS (DVT) Referring Phys: Alvino Blood --------------------------------------------------------------------------------  Indications: Chest pain, shortness of breath and right leg pain.  Comparison Study: No priors. Performing Technologist: Marilynne Halsted RDMS, RVT  Examination Guidelines: A complete evaluation includes B-mode imaging, spectral Doppler, color Doppler, and power Doppler as needed of all accessible portions of each vessel.  Bilateral testing is considered an integral part of a complete examination. Limited examinations for reoccurring indications may be performed as noted. The reflux portion of the exam is performed with the patient in reverse Trendelenburg.  +---------+---------------+---------+-----------+----------+--------------+ RIGHT    CompressibilityPhasicitySpontaneityPropertiesThrombus Aging +---------+---------------+---------+-----------+----------+--------------+ CFV      Full           Yes      Yes                                 +---------+---------------+---------+-----------+----------+--------------+ SFJ      Full                                                        +---------+---------------+---------+-----------+----------+--------------+  FV Prox  None                                         Acute          +---------+---------------+---------+-----------+----------+--------------+ FV Mid   None                                         Acute          +---------+---------------+---------+-----------+----------+--------------+ FV DistalNone                                         Acute          +---------+---------------+---------+-----------+----------+--------------+ PFV      Full                                                        +---------+---------------+---------+-----------+----------+--------------+ POP      Partial        No       No                   Acute          +---------+---------------+---------+-----------+----------+--------------+ PTV      Partial                                      Acute          +---------+---------------+---------+-----------+----------+--------------+ PERO                                                  Acute          +---------+---------------+---------+-----------+----------+--------------+ REIV                    Yes      Yes                  patent          +---------+---------------+---------+-----------+----------+--------------+ RCIV                    Yes      Yes                  patent         +---------+---------------+---------+-----------+----------+--------------+     Summary: RIGHT: - Findings consistent with acute deep vein thrombosis involving the right femoral vein, right popliteal vein, right posterior tibial veins, and right peroneal veins.  LEFT: - No evidence of common femoral vein obstruction.  *See table(s) above for measurements and observations.    Preliminary    DG Chest 2 View  Result Date: 03/23/2022 CLINICAL DATA:  Chest pain and short of breath.  DVT EXAM: CHEST - 2 VIEW COMPARISON:  Chest 02/12/2021 FINDINGS: The heart size and mediastinal contours are within normal limits. Both lungs are  clear. The visualized skeletal structures are unremarkable. IMPRESSION: No active cardiopulmonary disease. Electronically Signed   By: Marlan Palau M.D.   On: 03/23/2022 13:09    EKG: Not yet performed/released in the ED.  Assessment/Plan Active Problems:   Uncomplicated alcohol dependence (HCC)   PAD (peripheral artery disease) (HCC)   COPD (chronic obstructive pulmonary disease) (HCC)   Essential hypertension   H/O heart artery stent   Hyperlipidemia   OSA on CPAP   DVT (deep venous thrombosis) (HCC)   Hypokalemia   Acute pulmonary embolism (HCC)   Acute DVT and PE > Patient presenting after 2 to 3 days of right lower extremity pain for which he presented to his PCP office after developing chest pain and shortness of breath and was found to have a DVT. > In the ED CTA was positive for at least submassive pulmonary embolism with lobar and segmental artery involvement in both upper and lower lobes bilaterally.  RV to LV ratio of 1.04. > Has remained hemodynamically stable in the ED.  Started on heparin drip. > No provoking factors identified. - Monitor in progressive unit - Continue heparin drip -  Echocardiogram  Hypokalemia > Potassium noted to be 2.4 in ED. > 40 mEq PO and 40 mEq IV potassium ordered in ED. Magnesium pending. - Follow up Mg - Trend Potassium  Leukocytosis > Presumed reactive in the setting of acute DVT and PE and no other signs or symptoms of infection. - Continue to trend  Hypertension - Continue home amlodipine, lisinopril, metoprolol  Hyperlipidemia PAD CAD - Continue home lisinopril, metoprolol - Continue home atorvastatin - Continue home aspirin  Depression - Continue home doxepin and mirtazapine  COPD - Continue home.  Albuterol  OSA - Continue home CPAP  Alcohol use > History of significant alcohol use and has undergone detox twice in the past.  Has had hallucinations with detox but denies significant withdrawal symptoms otherwise denies any history of seizures. - Continue with CIWA with Ativan as ordered in the ED - Continue with thiamine, folate, multivitamin  DVT prophylaxis: Heparin Code Status:   Full, discussed with the patient Family Communication:  None on admission  Disposition Plan:   Patient is from:  Home  Anticipated DC to:  Home  Anticipated DC date:  1 to 3 days  Anticipated DC barriers: None  Consults called:  None Admission status:  Observation, progressive  Severity of Illness: The appropriate patient status for this patient is OBSERVATION. Observation status is judged to be reasonable and necessary in order to provide the required intensity of service to ensure the patient's safety. The patient's presenting symptoms, physical exam findings, and initial radiographic and laboratory data in the context of their medical condition is felt to place them at decreased risk for further clinical deterioration. Furthermore, it is anticipated that the patient will be medically stable for discharge from the hospital within 2 midnights of admission.   Synetta Fail MD [facility]  How to contact the [facility] Attending  or Consulting provider 7A - 7P or covering provider during after hours 7P -7A, for this patient?   Check the care team in [facility] and look for a) attending/consulting [facility] provider listed and b) the [facility] team listed Log into [email or web address] and use [place]'s universal password to access. If you do not have the password, please contact the hospital operator. Locate the [facility] provider you are looking for under [facility] and page to a number that you can be directly  reached. If you still have difficulty reaching the provider, please page the [facility] (Director on Call) for the Hospitalists listed on amion for assistance.  03/23/2022, 6:13 PM

## 2022-03-23 NOTE — ED Notes (Signed)
Called name 10x no answer cannot find pt

## 2022-03-23 NOTE — ED Provider Triage Note (Signed)
Emergency Medicine Provider Triage Evaluation Note  Ricardo [name] , a 61 y.o. male  was evaluated in triage.  Pt complains of chest pain, shortness of breath, and right leg pain.  Patient was notified by his primary care doctor that he does have a pulmonary embolism and DVT in the right leg.  He had an ultrasound and a CT scan of the chest on Monday.  I am unable to find those records.  Patient does state that he drinks alcohol daily.  He typically consumes 1 pint of vodka.  Review of Systems  Positive:  Negative: See above   Physical Exam  BP 131/89   Pulse (!) 116   Temp 98.9 F (37.2 C) (Oral)   Resp 18   SpO2 98%  Gen:   Awake, no distress   Resp:  Normal effort  MSK:   Moves extremities without difficulty  Other:  Tachycardic  Medical Decision Making  Medically screening exam initiated at 11:54 AM.  Appropriate orders placed.  Onaway Cellar Schrimpf was informed that the remainder of the evaluation will be completed by another provider, this initial triage assessment does not replace that evaluation, and the importance of remaining in the ED until their evaluation is complete.     Honor Loh [place], [place] 03/23/22 1156

## 2022-03-23 NOTE — ED Notes (Signed)
Pt vomited after morphine administration

## 2022-03-23 NOTE — ED Triage Notes (Addendum)
Patient BIB [facility] from home, patient states he was told by his PCP at [facility] that he has a DVT in his right leg. Patient also reports consuming significant amount of ETOH last night. Patient is alert, oriented, and in no apparent distress at this time.

## 2022-03-23 NOTE — ED Provider Notes (Signed)
Ricardo [name]   CSN: [id number] Arrival date & time: 03/23/22  1115     History  Chief Complaint  Patient presents with  . DVT    Ricardo [name] is a 61 y.o. male.  Patient is a 61 year old male with a past medical history of CAD status post stent placement, hypertension and alcohol use disorder presenting to the emergency department from his primary care office with right lower extremity pain.  Patient states that he has had pain and swelling in his right leg for the past 2 to 3 days.  He states that this morning he started to develop right-sided chest pain with some shortness of breath.  He denies any history of blood clots, recent hospitalizations or surgeries, recent long travels in the car or plane, hormone use or cancer history.  He had an ultrasound performed by his primary doctor that was positive for DVT and was recommended to come to the emergency department.  Denies any recent fevers or cough, nausea, vomiting or diarrhea.  He states that he drinks a pint of liquor per day but has never had alcohol withdrawal or withdrawal seizures in the past.  The history is provided by the patient and a relative (PCP).       Home Medications Prior to Admission medications   Medication Sig Start Date End Date Taking? Authorizing Provider  albuterol (VENTOLIN HFA) 108 (90 Base) MCG/ACT inhaler Inhale 1-2 puffs into the lungs every 6 (six) hours as needed for wheezing or shortness of breath.    [provider]  amLODipine (NORVASC) 5 MG tablet Take 5 mg by mouth every evening. 06/17/21   [provider]  aspirin EC 81 MG tablet Take 1 tablet (81 mg total) by mouth daily. Swallow whole. 11/15/21   Schuh, McKenzi P, PA-C  atorvastatin (LIPITOR) 40 MG tablet Take 40 mg by mouth in the morning. 08/30/21   [provider]  doxepin (SINEQUAN) 10 MG capsule Take 10 mg by mouth at bedtime.    [provider]   esomeprazole (NEXIUM) 40 MG capsule Take 40 mg by mouth daily before breakfast. 07/28/21   [provider]  lisinopril (PRINIVIL,ZESTRIL) 20 MG tablet Take 1 tablet (20 mg total) by mouth daily. For high blood pressure 11/30/16   Nwoko, Nicole Kindred I, NP  metoprolol succinate (TOPROL-XL) 25 MG 24 hr tablet Take 25 mg by mouth in the morning. 07/26/21   [provider]  mirtazapine (REMERON) 15 MG tablet Take 15 mg by mouth daily as needed (Sleep).    [provider]  naltrexone (DEPADE) 50 MG tablet Take 50 mg by mouth daily. 09/13/21   [provider]  rOPINIRole (REQUIP) 0.25 MG tablet Take 0.25 mg by mouth at bedtime as needed for sleep. Patient not taking: Reported on 11/08/2021 06/17/21   [provider]      Allergies    Bee venom    Review of Systems   Review of Systems  Physical Exam Updated Vital Signs BP (!) 157/98   Pulse (!) 105   Temp 98.2 F (36.8 C) (Oral)   Resp 14   Ht 5\' 9"  (1.753 m)   Wt 69.8 kg   SpO2 94%   BMI 22.72 kg/m  Physical Exam Vitals and nursing [name] reviewed.  Constitutional:      General: He is not in acute distress.    Appearance: Normal appearance.  HENT:     Head: Normocephalic and atraumatic.  Nose: Nose normal.     Mouth/Throat:     Mouth: Mucous membranes are moist.     Pharynx: Oropharynx is clear.  Eyes:     Extraocular Movements: Extraocular movements intact.     Conjunctiva/sclera: Conjunctivae normal.  Cardiovascular:     Rate and Rhythm: Regular rhythm. Tachycardia present.     Heart sounds: Normal heart sounds.  Pulmonary:     Effort: Pulmonary effort is normal.     Breath sounds: Normal breath sounds.  Abdominal:     General: Abdomen is flat.     Palpations: Abdomen is soft.     Tenderness: There is no abdominal tenderness.  Musculoskeletal:        General: Normal range of motion.     Cervical back: Normal range of motion and neck supple.     Right lower leg: Edema (2+ to foot and  ankle) present.     Comments: RLE mild erythema with swelling compared to the L, calf tenderness of RLE  Neurological:     General: No focal deficit present.     Mental Status: He is alert and oriented to person, place, and time.     Sensory: No sensory deficit.     Motor: No weakness.  Psychiatric:        Mood and Affect: Mood normal.        Behavior: Behavior normal.     ED Results / Procedures / Treatments   Labs (all labs ordered are listed, but only abnormal results are displayed) Labs Reviewed  CBC WITH DIFFERENTIAL/PLATELET - Abnormal; Notable for the following components:      Result Value   WBC 13.6 (*)    RBC 3.88 (*)    HCT 38.9 (*)    MCV 100.3 (*)    MCH 34.5 (*)    Neutro Abs 9.8 (*)    Monocytes Absolute 1.3 (*)    All other components within normal limits  BASIC METABOLIC PANEL - Abnormal; Notable for the following components:   Potassium 2.4 (*)    Glucose, Bld 111 (*)    BUN 6 (*)    All other components within normal limits  TROPONIN I (HIGH SENSITIVITY) - Abnormal; Notable for the following components:   Troponin I (High Sensitivity) 22 (*)    All other components within normal limits  MAGNESIUM  BRAIN NATRIURETIC PEPTIDE  TROPONIN I (HIGH SENSITIVITY)    EKG None  Radiology CT Angio Chest PE W/Cm &/Or Wo Cm  Result Date: 03/23/2022 CLINICAL DATA:  Chest pain and shortness of breath. Right lower extremity DVT. EXAM: CT ANGIOGRAPHY CHEST WITH CONTRAST TECHNIQUE: Multidetector CT imaging of the chest was performed using the standard protocol during bolus administration of intravenous contrast. Multiplanar CT image reconstructions and MIPs were obtained to evaluate the vascular anatomy. RADIATION DOSE REDUCTION: This exam was performed according to the departmental dose-optimization program which includes automated exposure control, adjustment of the mA and/or kV according to patient size and/or use of iterative reconstruction technique. CONTRAST:  22mL  OMNIPAQUE IOHEXOL 350 MG/ML SOLN COMPARISON:  CT chest dated March 17, 2022. FINDINGS: Cardiovascular: Satisfactory opacification of the pulmonary arteries to the segmental level. Acute nonocclusive lobar and segmental pulmonary emboli in the bilateral upper and lower lobes. Normal heart size with elevated RV/LV ratio of 1.04. No pericardial effusion. No thoracic aortic aneurysm or dissection. Coronary and aortic arch atherosclerotic vascular disease. Mediastinum/Nodes: No enlarged mediastinal, hilar, or axillary lymph nodes. Thyroid gland, trachea, and esophagus demonstrate  no significant findings. Lungs/Pleura: Prominent central peribronchial thickening. No focal consolidation, pleural effusion, or pneumothorax. Very mild centrilobular emphysema. Upper Abdomen: No acute abnormality. Unchanged diffuse severe hepatic steatosis. Musculoskeletal: No chest wall abnormality. No acute or significant osseous findings. Old healed bilateral rib fractures. Review of the MIP images confirms the above findings. IMPRESSION: 1. Acute nonocclusive lobar and segmental pulmonary emboli in the bilateral upper and lower lobes. Positive for acute PE with CT evidence of right heart strain (RV/LV Ratio = 1.04 ) consistent with at least submassive (intermediate risk) PE. The presence of right heart strain has been associated with an increased risk of morbidity and mortality. Please refer to the "Code PE Focused" order set in EPIC. 2. Unchanged severe hepatic steatosis. 3. Aortic Atherosclerosis (ICD10-I70.0) and Emphysema (ICD10-J43.9). Critical Value/emergent results were called by telephone at the time of interpretation on 03/23/2022 at 5:37 pm to provider [facility], who verbally acknowledged these results. Electronically Signed   By: Obie Dredge M.D.   On: 03/23/2022 17:40   VAS [place] LOWER EXTREMITY VENOUS (DVT) (7a-7p)  Result Date: 03/23/2022  Lower Venous DVT Study Patient Name:  KADIR AZUCENA Arington  Date of Exam:    03/23/2022 Medical Rec #: [id number]  Accession #:    [id number] Date of Birth: 30-Aug-1960   Patient Gender: M Patient Age:   54 years Exam Location:  [facility] Procedure:      VAS [place] LOWER EXTREMITY VENOUS (DVT) Referring Phys: Alvino Blood --------------------------------------------------------------------------------  Indications: Chest pain, shortness of breath and right leg pain.  Comparison Study: No priors. Performing Technologist: Marilynne Halsted RDMS, RVT  Examination Guidelines: A complete evaluation includes B-mode imaging, spectral Doppler, color Doppler, and power Doppler as needed of all accessible portions of each vessel. Bilateral testing is considered an integral part of a complete examination. Limited examinations for reoccurring indications may be performed as noted. The reflux portion of the exam is performed with the patient in reverse Trendelenburg.  +---------+---------------+---------+-----------+----------+--------------+ RIGHT    CompressibilityPhasicitySpontaneityPropertiesThrombus Aging +---------+---------------+---------+-----------+----------+--------------+ CFV      Full           Yes      Yes                                 +---------+---------------+---------+-----------+----------+--------------+ SFJ      Full                                                        +---------+---------------+---------+-----------+----------+--------------+ FV Prox  None                                         Acute          +---------+---------------+---------+-----------+----------+--------------+ FV Mid   None                                         Acute          +---------+---------------+---------+-----------+----------+--------------+ FV DistalNone  Acute          +---------+---------------+---------+-----------+----------+--------------+ PFV      Full                                                         +---------+---------------+---------+-----------+----------+--------------+ POP      Partial        No       No                   Acute          +---------+---------------+---------+-----------+----------+--------------+ PTV      Partial                                      Acute          +---------+---------------+---------+-----------+----------+--------------+ PERO                                                  Acute          +---------+---------------+---------+-----------+----------+--------------+ REIV                    Yes      Yes                  patent         +---------+---------------+---------+-----------+----------+--------------+ RCIV                    Yes      Yes                  patent         +---------+---------------+---------+-----------+----------+--------------+     Summary: RIGHT: - Findings consistent with acute deep vein thrombosis involving the right femoral vein, right popliteal vein, right posterior tibial veins, and right peroneal veins.  LEFT: - No evidence of common femoral vein obstruction.  *See table(s) above for measurements and observations.    Preliminary    DG Chest 2 View  Result Date: 03/23/2022 CLINICAL DATA:  Chest pain and short of breath.  DVT EXAM: CHEST - 2 VIEW COMPARISON:  Chest 02/12/2021 FINDINGS: The heart size and mediastinal contours are within normal limits. Both lungs are clear. The visualized skeletal structures are unremarkable. IMPRESSION: No active cardiopulmonary disease. Electronically Signed   By: Marlan Palau M.D.   On: 03/23/2022 13:09    Procedures Procedures    Medications Ordered in ED Medications  potassium chloride 10 mEq in 100 mL IVPB (10 mEq Intravenous New Bag/Given 03/23/22 1650)  LORazepam (ATIVAN) tablet 1-4 mg (has no administration in time range)    Or  LORazepam (ATIVAN) injection 1-4 mg (has no administration in time range)  thiamine (VITAMIN B1) tablet 100 mg (100 mg  Oral Given 03/23/22 1623)    Or  thiamine (VITAMIN B1) injection 100 mg ( Intravenous See Alternative 03/23/22 1623)  folic acid (FOLVITE) tablet 1 mg (1 mg Oral Given 03/23/22 1623)  multivitamin with minerals tablet 1 tablet (1 tablet Oral Given 03/23/22 1623)  potassium chloride SA (KLOR-CON M) CR tablet 40 mEq (40 mEq Oral  Given 03/23/22 1623)  oxyCODONE-acetaminophen (PERCOCET/ROXICET) 5-325 MG per tablet 1 tablet (1 tablet Oral Given 03/23/22 1623)  iohexol (OMNIPAQUE) 350 MG/ML injection 75 mL (75 mLs Intravenous Contrast Given 03/23/22 1719)    ED Course/ Medical Decision Making/ A&P Clinical Course as of 03/23/22 1749  Thu Mar 23, 2022  1748 I received a call from radiology that CT PE study was positive for bilateral lobar and segmental PEs with mild right heart strain.  The patient's troponin is mildly elevated he has otherwise remained hemodynamically stable.  He will be started on heparin and will be admitted for management of his VTE with his electrolyte derangements. [VK]    Clinical Course User Index [VK] Rexford MausKingsley, Neetu Carrozza K, DO                           Medical Decision Making This patient presents to the ED with chief complaint(s) of RLE pain with pertinent past medical history of CAD, HTN, ETOH use which further complicates the presenting complaint. The complaint involves an extensive differential diagnosis and also carries with it a high risk of complications and morbidity.    The differential diagnosis includes patient had DVT diagnosed outpatient, considering also PE with chest pain and shortness of breath starting today, electrolyte abnormality in the setting of his alcohol use, alcohol withdrawal, ACS  Additional history obtained: Additional history obtained from PCP Records reviewed previous admission documents  ED Course and Reassessment: Patient was initially evaluated by provider in triage and had labs, repeat DVT study and CT PE study performed.  His DVT  study did confirm a DVT of the right lower extremity from the femoral vein to the calf.  The patient's chest pain and shortness of breath he also requires a CT PE study.  Labs incidentally showed severe hypokalemia with a potassium of 2.4.  Magnesium will be ordered and he will be started on electrolyte repletion.  Due to patient's alcohol use he was placed on a CIWA protocol.  Patient will likely require admission for electrolyte repletion and VTE treatment.  Independent labs interpretation:  The following labs were independently interpreted: hypokalemia, mildly elevated troponin  Independent visualization of imaging: - I independently visualized the following imaging with scope of interpretation limited to determining acute life threatening conditions related to emergency care: CXR, CTPE, RLE DVT US, which revealed DVT of RLE, bilateral lobal and segmental PEs  Consultation: - Consulted or discussed management/test interpretation w/ external professional: Hospitalist  Consideration for admission or further workup: patient requires admission for further management of his VTEs and electrolyte derangements  Social Determinants of health: ETOH use disorder    Amount and/or Complexity of Data Reviewed Labs: ordered.  Risk OTC drugs. Prescription drug management. Decision regarding hospitalization.           Final Clinical Impression(s) / ED Diagnoses Final diagnoses:  Acute deep vein thrombosis (DVT) of femoral vein of right lower extremity (HCC)  Hypokalemia  Multiple subsegmental pulmonary emboli without acute cor pulmonale [facility](HCC)    Rx / DC Orders ED Discharge Orders     None         Rexford MausKingsley, Camauri Craton K, DO 03/23/22 1750

## 2022-03-23 NOTE — Progress Notes (Signed)
ANTICOAGULATION CONSULT NOTE - Initial Consult  Pharmacy Consult for heparin  Indication: pulmonary embolus  Allergies  Allergen Reactions   Bee Venom Anaphylaxis    Patient Measurements:   Heparin Dosing Weight: 69.8kg    Vital Signs: Temp: 98.2 F (36.8 C) (12/21 1344) Temp Source: Oral (12/21 1344) BP: 157/98 (12/21 1615) Pulse Rate: 105 (12/21 1615)  Labs: Recent Labs    03/23/22 1200  HGB 13.4  HCT 38.9*  PLT 372  CREATININE 0.90  TROPONINIHS 22*    CrCl cannot be calculated (Unknown ideal weight.).   Medical History: Past Medical History:  Diagnosis Date   Coronary artery disease    Hyperlipidemia    Hypertension     Assessment: Patient presenting from home says he was told by PCP that he had a DVT in right leg. CTA shows PE with right heart strain. Patient not on anticoagulation PTA. CBC and renal function is stable.   Goal of Therapy:  Heparin level 0.3-0.7 units/ml Monitor platelets by anticoagulation protocol: Yes   Plan:  Give 4000 units bolus x 1 Start heparin infusion at 1100 units/hr Check anti-Xa level in 6 hours and daily while on heparin Continue to monitor H&H and platelets  Estill Batten, PharmD, BCCCP  03/23/2022,5:42 PM

## 2022-03-23 NOTE — ED Notes (Signed)
Called no answer

## 2022-03-23 NOTE — Progress Notes (Addendum)
Right lower ext venous  has been completed. Refer to  [facility] under chart review to view preliminary results.   03/23/2022  2:37 PM Karanvir Balderston, Gerarda Gunther

## 2022-03-24 ENCOUNTER — Observation Stay (HOSPITAL_BASED_OUTPATIENT_CLINIC_OR_DEPARTMENT_OTHER): Payer: Medicaid Other

## 2022-03-24 DIAGNOSIS — F1020 Alcohol dependence, uncomplicated: Secondary | ICD-10-CM | POA: Diagnosis present

## 2022-03-24 DIAGNOSIS — Z79899 Other long term (current) drug therapy: Secondary | ICD-10-CM | POA: Diagnosis not present

## 2022-03-24 DIAGNOSIS — I2690 Septic pulmonary embolism without acute cor pulmonale: Secondary | ICD-10-CM

## 2022-03-24 DIAGNOSIS — E876 Hypokalemia: Secondary | ICD-10-CM | POA: Diagnosis not present

## 2022-03-24 DIAGNOSIS — F17210 Nicotine dependence, cigarettes, uncomplicated: Secondary | ICD-10-CM | POA: Diagnosis present

## 2022-03-24 DIAGNOSIS — I2694 Multiple subsegmental pulmonary emboli without acute cor pulmonale: Secondary | ICD-10-CM | POA: Diagnosis not present

## 2022-03-24 DIAGNOSIS — F32A Depression, unspecified: Secondary | ICD-10-CM | POA: Diagnosis present

## 2022-03-24 DIAGNOSIS — I82411 Acute embolism and thrombosis of right femoral vein: Secondary | ICD-10-CM | POA: Diagnosis present

## 2022-03-24 DIAGNOSIS — E785 Hyperlipidemia, unspecified: Secondary | ICD-10-CM | POA: Diagnosis present

## 2022-03-24 DIAGNOSIS — I82401 Acute embolism and thrombosis of unspecified deep veins of right lower extremity: Secondary | ICD-10-CM

## 2022-03-24 DIAGNOSIS — N179 Acute kidney failure, unspecified: Secondary | ICD-10-CM | POA: Diagnosis not present

## 2022-03-24 DIAGNOSIS — I10 Essential (primary) hypertension: Secondary | ICD-10-CM | POA: Diagnosis present

## 2022-03-24 DIAGNOSIS — J439 Emphysema, unspecified: Secondary | ICD-10-CM | POA: Diagnosis present

## 2022-03-24 DIAGNOSIS — I5030 Unspecified diastolic (congestive) heart failure: Secondary | ICD-10-CM

## 2022-03-24 DIAGNOSIS — Z7982 Long term (current) use of aspirin: Secondary | ICD-10-CM | POA: Diagnosis not present

## 2022-03-24 DIAGNOSIS — Z6372 Alcoholism and drug addiction in family: Secondary | ICD-10-CM | POA: Diagnosis not present

## 2022-03-24 DIAGNOSIS — K760 Fatty (change of) liver, not elsewhere classified: Secondary | ICD-10-CM | POA: Diagnosis present

## 2022-03-24 DIAGNOSIS — J438 Other emphysema: Secondary | ICD-10-CM | POA: Diagnosis not present

## 2022-03-24 DIAGNOSIS — Z955 Presence of coronary angioplasty implant and graft: Secondary | ICD-10-CM | POA: Diagnosis not present

## 2022-03-24 DIAGNOSIS — Z811 Family history of alcohol abuse and dependence: Secondary | ICD-10-CM | POA: Diagnosis not present

## 2022-03-24 DIAGNOSIS — Z91030 Bee allergy status: Secondary | ICD-10-CM | POA: Diagnosis not present

## 2022-03-24 DIAGNOSIS — J449 Chronic obstructive pulmonary disease, unspecified: Secondary | ICD-10-CM | POA: Diagnosis present

## 2022-03-24 DIAGNOSIS — I739 Peripheral vascular disease, unspecified: Secondary | ICD-10-CM | POA: Diagnosis present

## 2022-03-24 DIAGNOSIS — I2510 Atherosclerotic heart disease of native coronary artery without angina pectoris: Secondary | ICD-10-CM | POA: Diagnosis present

## 2022-03-24 DIAGNOSIS — G4733 Obstructive sleep apnea (adult) (pediatric): Secondary | ICD-10-CM | POA: Diagnosis present

## 2022-03-24 LAB — HEPARIN LEVEL (UNFRACTIONATED)
Heparin Unfractionated: 0.1 IU/mL — ABNORMAL LOW (ref 0.30–0.70)
Heparin Unfractionated: 0.28 IU/mL — ABNORMAL LOW (ref 0.30–0.70)
Heparin Unfractionated: 0.47 IU/mL (ref 0.30–0.70)

## 2022-03-24 LAB — CBC
HCT: 37.7 % — ABNORMAL LOW (ref 39.0–52.0)
Hemoglobin: 13.2 g/dL (ref 13.0–17.0)
MCH: 35.1 pg — ABNORMAL HIGH (ref 26.0–34.0)
MCHC: 35 g/dL (ref 30.0–36.0)
MCV: 100.3 fL — ABNORMAL HIGH (ref 80.0–100.0)
Platelets: 303 10*3/uL (ref 150–400)
RBC: 3.76 MIL/uL — ABNORMAL LOW (ref 4.22–5.81)
RDW: 13.5 % (ref 11.5–15.5)
WBC: 13.9 10*3/uL — ABNORMAL HIGH (ref 4.0–10.5)
nRBC: 0 % (ref 0.0–0.2)

## 2022-03-24 LAB — COMPREHENSIVE METABOLIC PANEL
ALT: 98 U/L — ABNORMAL HIGH (ref 0–44)
AST: 116 U/L — ABNORMAL HIGH (ref 15–41)
Albumin: 3.2 g/dL — ABNORMAL LOW (ref 3.5–5.0)
Alkaline Phosphatase: 106 U/L (ref 38–126)
Anion gap: 17 — ABNORMAL HIGH (ref 5–15)
BUN: 9 mg/dL (ref 8–23)
CO2: 29 mmol/L (ref 22–32)
Calcium: 9.3 mg/dL (ref 8.9–10.3)
Chloride: 94 mmol/L — ABNORMAL LOW (ref 98–111)
Creatinine, Ser: 0.98 mg/dL (ref 0.61–1.24)
GFR, Estimated: >60 mL/min (ref >60)
Glucose, Bld: 94 mg/dL (ref 70–99)
Potassium: 2.9 mmol/L — ABNORMAL LOW (ref 3.5–5.1)
Sodium: 140 mmol/L (ref 135–145)
Total Bilirubin: 1.1 mg/dL (ref 0.3–1.2)
Total Protein: 6.2 g/dL — ABNORMAL LOW (ref 6.5–8.1)

## 2022-03-24 LAB — PHOSPHORUS: Phosphorus: 1.5 mg/dL — ABNORMAL LOW (ref 2.5–4.6)

## 2022-03-24 LAB — ECHOCARDIOGRAM COMPLETE
AR max vel: 2.44 cm2
AV Area VTI: 2.2 cm2
AV Area mean vel: 2.21 cm2
AV Mean grad: 4 mmHg
AV Peak grad: 7.7 mmHg
Ao pk vel: 1.39 m/s
Height: 69 in
S' Lateral: 3.2 cm
Weight: 2462.1 oz

## 2022-03-24 LAB — MAGNESIUM: Magnesium: 1 mg/dL — ABNORMAL LOW (ref 1.7–2.4)

## 2022-03-24 MED ORDER — POTASSIUM PHOSPHATES 15 MMOLE/5ML IV SOLN
30.0000 mmol | Freq: Once | INTRAVENOUS | Status: AC
  Administered 2022-03-24: 30 mmol via INTRAVENOUS
  Filled 2022-03-24: qty 10

## 2022-03-24 MED ORDER — DIAZEPAM 2 MG PO TABS
2.0000 mg | ORAL_TABLET | Freq: Three times a day (TID) | ORAL | Status: DC
  Administered 2022-03-24 – 2022-03-25 (×4): 2 mg via ORAL
  Filled 2022-03-24 (×4): qty 1

## 2022-03-24 MED ORDER — POTASSIUM CHLORIDE CRYS ER 20 MEQ PO TBCR
40.0000 meq | EXTENDED_RELEASE_TABLET | ORAL | Status: AC
  Administered 2022-03-24 (×2): 40 meq via ORAL
  Filled 2022-03-24 (×2): qty 2

## 2022-03-24 MED ORDER — NICOTINE 21 MG/24HR TD PT24
21.0000 mg | MEDICATED_PATCH | Freq: Every day | TRANSDERMAL | Status: DC
  Administered 2022-03-24 – 2022-03-25 (×2): 21 mg via TRANSDERMAL
  Filled 2022-03-24 (×2): qty 1

## 2022-03-24 MED ORDER — MAGNESIUM SULFATE 4 GM/100ML IV SOLN
4.0000 g | Freq: Once | INTRAVENOUS | Status: AC
  Administered 2022-03-24: 4 g via INTRAVENOUS
  Filled 2022-03-24: qty 100

## 2022-03-24 MED ORDER — HEPARIN BOLUS VIA INFUSION
2000.0000 [IU] | Freq: Once | INTRAVENOUS | Status: AC
  Administered 2022-03-24: 2000 [IU] via INTRAVENOUS
  Filled 2022-03-24: qty 2000

## 2022-03-24 NOTE — Progress Notes (Addendum)
PROGRESS NOTE     Ricardo PrinceRoy Fretwell, is a 61 y.o. male, DOB - 04/28/1960, ZOX:[id number]  Admit date - 03/23/2022   Admitting Physician Synetta FailAlexander B Melvin, MD  Outpatient Primary MD for the patient is [facility]  LOS - 0  Chief Complaint  Patient presents with   DVT      Brief Narrative:   61 y.o. male with medical history significant of hypertension, hyperlipidemia, PAD, CAD, depression, COPD, OSA, alcohol tobacco abuse admitted on 03/23/2022 with acute right lower extremity DVT and pulmonary embolism -     -Assessment and Plan: 1)Acute Rt LE DVT and acute pulmonary embolism >-Rt Leg pains and chest discomfort improving -CTA chest consistent with acute PE -Right lower extremity Dopplers with extensive DVT of the right lower extremity -Echo with EF of 50 to 55% and grade 1 diastolic dysfunction -No definite triggers/provoking factors of DVT and PE noted -Anticipate anticoagulation for at least 6 months probably lifelong -Malignancy workup including, PSA, dedicated CT abdomen and pelvis, updated colonoscopy and EGD advised especially given alcohol and tobacco abuse -Continue IV heparin for now -Possible transition to DOAC on 03/25/2022 -Leukocytosis persist I suspect this is reactive in the setting of DVT and PE -Patient will need to be ambulated to see if he needs oxygen prior to discharge home   2)Hypokalemia/hypomagnesemia and hypophosphatemia > Significant electrolyte abnormalities in the setting of alcohol abuse  -Denies vomiting or diarrhea  -Replace potassium, magnesium and phosphorus -  3)Hypertension - Continue home amlodipine, lisinopril, metoprolol   4)Hyperlipidemia/PAD/CAD - Continue home lisinopril, metoprolol and aspirin   5)Depression - Continue home doxepin and mirtazapine   6)COPD/emphysema/Tobacco Abuse -No acute exacerbation, continue bronchodilators  - 7)OSA - Continue home CPAP   8)Alcohol Use-high risk for  DTs > History of significant alcohol use and has undergone detox twice in the past.  Has had hallucinations with detox but denies significant withdrawal symptoms otherwise denies any history of seizures. - Continue with CIWA with Ativan as ordered  - Continue with thiamine, folate, multivitamin  9)Transaminitis---AST > ALT- -imaging studies suggest severe hepatic steatosis Suspect EtOH related -Check viral hepatitis profile -Hold atorvastatin  Status is: Inpatient   Disposition: The patient is from: Home              Anticipated d/c is to: Home--- possible transition to DOAC from IV heparin on 03/25/2022 and discharged home on 03/25/2022              Anticipated d/c date is: 1 day              Patient currently is not medically stable to d/c. Barriers: Not Clinically Stable-   Code Status :  -  Code Status: Full Code   Family Communication:    NA (patient is alert, awake and coherent)   DVT Prophylaxis  :   -iV heparin     Lab Results  Component Value Date   PLT 303 03/24/2022    Inpatient Medications  Scheduled Meds:  amLODipine  5 mg Oral QPM   aspirin EC  81 mg Oral Daily   atorvastatin  40 mg Oral q AM   diazepam  2 mg Oral TID   folic acid  1 mg Oral Daily   lisinopril  20 mg Oral Daily   metoprolol succinate  25 mg Oral q AM   multivitamin with minerals  1 tablet Oral Daily   nicotine  21 mg Transdermal Daily  pantoprazole  40 mg Oral Daily   sodium chloride flush  3 mL Intravenous Q12H   thiamine  100 mg Oral Daily   Or   thiamine  100 mg Intravenous Daily   Continuous Infusions:  heparin 1,500 Units/hr (03/24/22 1439)   potassium PHOSPHATE IVPB (in mmol)     PRN Meds:.acetaminophen **OR** acetaminophen, albuterol, LORazepam **OR** LORazepam, mirtazapine, polyethylene glycol   Anti-infectives (From admission, onward)    None        Subjective: Ricardo [name] today has no fevers, no emesis,    -Leg pain and chest discomfort  improving - Objective: Vitals:   03/24/22 0800 03/24/22 1116 03/24/22 1217 03/24/22 1446  BP: (!) 147/89 (!) 132/94  (!) 140/97  Pulse: 78 86  75  Resp: 13 14  15   Temp:   98.8 F (37.1 C) 98.8 F (37.1 C)  TempSrc:   Oral Oral  SpO2: 95% 97%  100%  Weight:      Height:        Intake/Output Summary (Last 24 hours) at 03/24/2022 1557 Last data filed at 03/23/2022 2144 Gross per 24 hour  Intake 285.79 ml  Output --  Net 285.79 ml   Filed Weights   03/23/22 1700  Weight: 69.8 kg   Physical Exam  Gen:- Awake Alert,  in no apparent distress  HEENT:- [place].AT, No sclera icterus Neck-Supple Neck,No JVD,.  Lungs-  CTAB , fair symmetrical air movement CV- S1, S2 normal, regular  Abd-  +ve B.Sounds, Abd Soft, No tenderness,    Extremity/Skin:-Right leg swelling and discomfort is not worse, pedal pulses present  Psych-affect is appropriate, oriented x3 Neuro-generalized weakness, no new focal deficits, mild tremors  Data Reviewed: I have personally reviewed following labs and imaging studies  CBC: Recent Labs  Lab 03/23/22 1200 03/24/22 0824  WBC 13.6* 13.9*  NEUTROABS 9.8*  --   HGB 13.4 13.2  HCT 38.9* 37.7*  MCV 100.3* 100.3*  PLT 372 303   Basic Metabolic Panel: Recent Labs  Lab 03/23/22 1200 03/23/22 1701 03/24/22 0824  NA 139  --  140  K 2.4*  --  2.9*  CL 99  --  94*  CO2 27  --  29  GLUCOSE 111*  --  94  BUN 6*  --  9  CREATININE 0.90  --  0.98  CALCIUM 9.0  --  9.3  MG  --  1.1* 1.0*  PHOS  --   --  1.5*   GFR: Estimated Creatinine Clearance: 78.1 mL/min (by C-G formula based on SCr of 0.98 mg/dL). Liver Function Tests: Recent Labs  Lab 03/24/22 0824  AST 116*  ALT 98*  ALKPHOS 106  BILITOT 1.1  PROT 6.2*  ALBUMIN 3.2*   Radiology Studies: ECHOCARDIOGRAM COMPLETE  Result Date: 03/24/2022    ECHOCARDIOGRAM REPORT   Patient Name:   HERMES WAFER  Date of Exam: 03/24/2022 Medical Rec #:  03/26/2022  Height:       69.0 in Accession #:     [id number] Weight:       153.9 lb Date of Birth:  08/19/1960   BSA:          1.848 m Patient Age:    61 years   BP:           132/94 mmHg Patient Gender: M          HR:           76 bpm. Exam Location:  Inpatient Procedure:  2D Echo, Cardiac Doppler and Color Doppler Indications:    Pulmonary Embolus I26.09  History:        Patient has no prior history of Echocardiogram examinations. PAD                 and COPD; Risk Factors:Hypertension, Sleep Apnea and                 Dyslipidemia. H/O heart artery stent 11/26/2017.  Sonographer:    Lucendia Herrlich Referring Phys: Cecille Po MELVIN IMPRESSIONS  1. Left ventricular ejection fraction, by estimation, is 50 to 55%. The left ventricle has low normal function. The left ventricle has no regional wall motion abnormalities. Left ventricular diastolic parameters are consistent with Grade I diastolic dysfunction (impaired relaxation).  2. Right ventricular systolic function is normal. The right ventricular size is normal. Tricuspid regurgitation signal is inadequate for assessing PA pressure.  3. No evidence of mitral valve regurgitation.  4. The aortic valve was not well visualized. Aortic valve regurgitation is not visualized.  5. The inferior vena cava is normal in size with greater than 50% respiratory variability, suggesting right atrial pressure of 3 mmHg. Comparison(s): No prior Echocardiogram. FINDINGS  Left Ventricle: Left ventricular ejection fraction, by estimation, is 50 to 55%. The left ventricle has low normal function. The left ventricle has no regional wall motion abnormalities. The left ventricular internal cavity size was normal in size. There is no left ventricular hypertrophy. Left ventricular diastolic parameters are consistent with Grade I diastolic dysfunction (impaired relaxation). Right Ventricle: The right ventricular size is normal. Right ventricular systolic function is normal. Tricuspid regurgitation signal is inadequate for assessing PA  pressure. Left Atrium: Left atrial size was normal in size. Right Atrium: Right atrial size was normal in size. Pericardium: There is no evidence of pericardial effusion. Mitral Valve: No evidence of mitral valve regurgitation. Tricuspid Valve: Tricuspid valve regurgitation is not demonstrated. Aortic Valve: The aortic valve was not well visualized. Aortic valve regurgitation is not visualized. Aortic valve mean gradient measures 4.0 mmHg. Aortic valve peak gradient measures 7.7 mmHg. Aortic valve area, by VTI measures 2.20 cm. Pulmonic Valve: Pulmonic valve regurgitation is not visualized. Aorta: The aortic root and ascending aorta are structurally normal, with no evidence of dilitation. Venous: The inferior vena cava is normal in size with greater than 50% respiratory variability, suggesting right atrial pressure of 3 mmHg. IAS/Shunts: No atrial level shunt detected by color flow Doppler.  LEFT VENTRICLE PLAX 2D LVIDd:         4.50 cm   Diastology LVIDs:         3.20 cm   LV e' medial:  6.96 cm/s LV PW:         1.00 cm   LV e' lateral: 8.59 cm/s LV IVS:        0.90 cm LVOT diam:     2.00 cm LV SV:         61 LV SV Index:   33 LVOT Area:     3.14 cm  3D Volume EF:                          3D EF:        50 %                          LV EDV:       136 ml  LV ESV:       68 ml                          LV SV:        68 ml RIGHT VENTRICLE RV Basal diam:  3.20 cm RV S prime:     13.70 cm/s TAPSE (M-mode): 1.8 cm LEFT ATRIUM             Index        RIGHT ATRIUM           Index LA diam:        3.30 cm 1.79 cm/m   RA Area:     10.80 cm LA Vol (A2C):   44.3 ml 23.97 ml/m  RA Volume:   22.10 ml  11.96 ml/m LA Vol (A4C):   40.3 ml 21.81 ml/m LA Biplane Vol: 45.3 ml 24.51 ml/m  AORTIC VALVE AV Area (Vmax):    2.44 cm AV Area (Vmean):   2.21 cm AV Area (VTI):     2.20 cm AV Vmax:           139.00 cm/s AV Vmean:          95.700 cm/s AV VTI:            0.278 m AV Peak Grad:      7.7 mmHg AV Mean  Grad:      4.0 mmHg LVOT Vmax:         108.00 cm/s LVOT Vmean:        67.300 cm/s LVOT VTI:          0.195 m LVOT/AV VTI ratio: 0.70  AORTA Ao Root diam: 3.70 cm  SHUNTS Systemic VTI:  0.20 m Systemic Diam: 2.00 cm Carolan Clines Electronically signed by Carolan Clines Signature Date/Time: 03/24/2022/3:23:01 PM    Final    VAS [place] LOWER EXTREMITY VENOUS (DVT) (7a-7p)  Result Date: 03/24/2022  Lower Venous DVT Study Patient Name:  BYRANT VALENT Boettger  Date of Exam:   03/23/2022 Medical Rec #: [id number]  Accession #:    [id number] Date of Birth: March 06, 1961   Patient Gender: M Patient Age:   66 years Exam Location:  [facility] Procedure:      VAS [place] LOWER EXTREMITY VENOUS (DVT) Referring Phys: Alvino Blood --------------------------------------------------------------------------------  Indications: Chest pain, shortness of breath and right leg pain.  Comparison Study: No priors. Performing Technologist: Marilynne Halsted RDMS, RVT  Examination Guidelines: A complete evaluation includes B-mode imaging, spectral Doppler, color Doppler, and power Doppler as needed of all accessible portions of each vessel. Bilateral testing is considered an integral part of a complete examination. Limited examinations for reoccurring indications may be performed as noted. The reflux portion of the exam is performed with the patient in reverse Trendelenburg.  +---------+---------------+---------+-----------+----------+--------------+ RIGHT    CompressibilityPhasicitySpontaneityPropertiesThrombus Aging +---------+---------------+---------+-----------+----------+--------------+ CFV      Full           Yes      Yes                                 +---------+---------------+---------+-----------+----------+--------------+ SFJ      Full                                                        +---------+---------------+---------+-----------+----------+--------------+  FV Prox  None                                          Acute          +---------+---------------+---------+-----------+----------+--------------+ FV Mid   None                                         Acute          +---------+---------------+---------+-----------+----------+--------------+ FV DistalNone                                         Acute          +---------+---------------+---------+-----------+----------+--------------+ PFV      Full                                                        +---------+---------------+---------+-----------+----------+--------------+ POP      Partial        No       No                   Acute          +---------+---------------+---------+-----------+----------+--------------+ PTV      Partial                                      Acute          +---------+---------------+---------+-----------+----------+--------------+ PERO                                                  Acute          +---------+---------------+---------+-----------+----------+--------------+ REIV                    Yes      Yes                  patent         +---------+---------------+---------+-----------+----------+--------------+ RCIV                    Yes      Yes                  patent         +---------+---------------+---------+-----------+----------+--------------+     Summary: RIGHT: - Findings consistent with acute deep vein thrombosis involving the right femoral vein, right popliteal vein, right posterior tibial veins, and right peroneal veins.  LEFT: - No evidence of common femoral vein obstruction.  *See table(s) above for measurements and observations. Electronically signed by Waverly Ferrari MD on 03/24/2022 at 8:11:25 AM.    Final    CT Angio Chest PE W/Cm &/Or Wo Cm  Result Date: 03/23/2022 CLINICAL DATA:  Chest pain and shortness of breath. Right lower extremity DVT. EXAM: CT ANGIOGRAPHY CHEST WITH CONTRAST  TECHNIQUE: Multidetector CT imaging of the chest was performed using  the standard protocol during bolus administration of intravenous contrast. Multiplanar CT image reconstructions and MIPs were obtained to evaluate the vascular anatomy. RADIATION DOSE REDUCTION: This exam was performed according to the departmental dose-optimization program which includes automated exposure control, adjustment of the mA and/or kV according to patient size and/or use of iterative reconstruction technique. CONTRAST:  75mL OMNIPAQUE IOHEXOL 350 MG/ML SOLN COMPARISON:  CT chest dated March 17, 2022. FINDINGS: Cardiovascular: Satisfactory opacification of the pulmonary arteries to the segmental level. Acute nonocclusive lobar and segmental pulmonary emboli in the bilateral upper and lower lobes. Normal heart size with elevated RV/LV ratio of 1.04. No pericardial effusion. No thoracic aortic aneurysm or dissection. Coronary and aortic arch atherosclerotic vascular disease. Mediastinum/Nodes: No enlarged mediastinal, hilar, or axillary lymph nodes. Thyroid gland, trachea, and esophagus demonstrate no significant findings. Lungs/Pleura: Prominent central peribronchial thickening. No focal consolidation, pleural effusion, or pneumothorax. Very mild centrilobular emphysema. Upper Abdomen: No acute abnormality. Unchanged diffuse severe hepatic steatosis. Musculoskeletal: No chest wall abnormality. No acute or significant osseous findings. Old healed bilateral rib fractures. Review of the MIP images confirms the above findings. IMPRESSION: 1. Acute nonocclusive lobar and segmental pulmonary emboli in the bilateral upper and lower lobes. Positive for acute PE with CT evidence of right heart strain (RV/LV Ratio = 1.04 ) consistent with at least submassive (intermediate risk) PE. The presence of right heart strain has been associated with an increased risk of morbidity and mortality. Please refer to the "Code PE Focused" order set in EPIC. 2. Unchanged severe hepatic steatosis. 3. Aortic Atherosclerosis  (ICD10-I70.0) and Emphysema (ICD10-J43.9). Critical Value/emergent results were called by telephone at the time of interpretation on 03/23/2022 at 5:37 pm to provider [facility], who verbally acknowledged these results. Electronically Signed   By: Obie Dredge M.D.   On: 03/23/2022 17:40   DG Chest 2 View  Result Date: 03/23/2022 CLINICAL DATA:  Chest pain and short of breath.  DVT EXAM: CHEST - 2 VIEW COMPARISON:  Chest 02/12/2021 FINDINGS: The heart size and mediastinal contours are within normal limits. Both lungs are clear. The visualized skeletal structures are unremarkable. IMPRESSION: No active cardiopulmonary disease. Electronically Signed   By: Marlan Palau M.D.   On: 03/23/2022 13:09    Scheduled Meds:  amLODipine  5 mg Oral QPM   aspirin EC  81 mg Oral Daily   atorvastatin  40 mg Oral q AM   diazepam  2 mg Oral TID   folic acid  1 mg Oral Daily   lisinopril  20 mg Oral Daily   metoprolol succinate  25 mg Oral q AM   multivitamin with minerals  1 tablet Oral Daily   nicotine  21 mg Transdermal Daily   pantoprazole  40 mg Oral Daily   sodium chloride flush  3 mL Intravenous Q12H   thiamine  100 mg Oral Daily   Or   thiamine  100 mg Intravenous Daily   Continuous Infusions:  heparin 1,500 Units/hr (03/24/22 1439)   potassium PHOSPHATE IVPB (in mmol)      LOS: 0 days   Shon Hale M.D on 03/24/2022 at 3:57 PM  Go to [email or web address] - for contact info  [facility] - Office  [phone]  If 7PM-7AM, please contact night-coverage [email or web address] 03/24/2022, 3:57 PM

## 2022-03-24 NOTE — Progress Notes (Signed)
Pt on room air and tolerating. Pt refuses CPAP for OSA at this time. No distress noted will continue to monitor

## 2022-03-24 NOTE — Progress Notes (Signed)
Patient brought to 4E from ED. VSS. Telemetry box applied, CCMD notified. Patient oriented to room and staff. Call bell in reach.  Naleigha Raimondi L Yomaris Palecek, RN  

## 2022-03-24 NOTE — Progress Notes (Signed)
ANTICOAGULATION CONSULT NOTE - Follow Up Consult  Pharmacy Consult for heparin  Indication: pulmonary embolus  Allergies  Allergen Reactions   Bee Venom Anaphylaxis    Patient Measurements: Height: 5\' 9"  (175.3 cm) Weight: 69.8 kg (153 lb 14.1 oz) IBW/kg (Calculated) : 70.7 Heparin Dosing Weight: 69.8kg    Vital Signs: Temp: 98.1 F (36.7 C) (12/22 1928) Temp Source: Oral (12/22 1928) BP: 134/91 (12/22 1743) Pulse Rate: 85 (12/22 1743)  Labs: Recent Labs    03/23/22 1200 03/23/22 1701 03/24/22 0105 03/24/22 0824 03/24/22 1256 03/24/22 2021  HGB 13.4  --   --  13.2  --   --   HCT 38.9*  --   --  37.7*  --   --   PLT 372  --   --  303  --   --   HEPARINUNFRC  --   --  0.10*  --  0.28* 0.47  CREATININE 0.90  --   --  0.98  --   --   TROPONINIHS 22* 20*  --   --   --   --      Estimated Creatinine Clearance: 78.1 mL/min (by C-G formula based on SCr of 0.98 mg/dL).   Medical History: Past Medical History:  Diagnosis Date   Coronary artery disease    Hyperlipidemia    Hypertension     Assessment: Patient presenting from home says he was told by PCP that he had a DVT in right leg. CTA shows PE with right heart strain. Patient not on anticoagulation PTA. CBC and renal function is stable.   Heparin level therapeutic (0.47) on infusion at 1500 units/hr. No bleeding noted.  Goal of Therapy:  Heparin level 0.3-0.7 units/ml Monitor platelets by anticoagulation protocol: Yes   Plan:  Continue heparin infusion at 1500 u/hr.  F/u a.m. heparin level to confirm therapeutic  03/26/22, PharmD, BCPS Please see amion for complete clinical pharmacist phone list 03/24/2022,9:00 PM

## 2022-03-24 NOTE — Progress Notes (Signed)
ANTICOAGULATION CONSULT NOTE   Pharmacy Consult for heparin  Indication: pulmonary embolus  Allergies  Allergen Reactions   Bee Venom Anaphylaxis    Patient Measurements: Height: 5\' 9"  (175.3 cm) Weight: 69.8 kg (153 lb 14.1 oz) IBW/kg (Calculated) : 70.7 Heparin Dosing Weight: 69.8kg    Vital Signs: BP: 152/94 (12/21 2200) Pulse Rate: 97 (12/21 2200)  Labs: Recent Labs    03/23/22 1200 03/23/22 1701 03/24/22 0105  HGB 13.4  --   --   HCT 38.9*  --   --   PLT 372  --   --   HEPARINUNFRC  --   --  0.10*  CREATININE 0.90  --   --   TROPONINIHS 22* 20*  --      Estimated Creatinine Clearance: 85.1 mL/min (by C-G formula based on SCr of 0.9 mg/dL).   Medical History: Past Medical History:  Diagnosis Date   Coronary artery disease    Hyperlipidemia    Hypertension     Assessment: Patient presenting from home says he was told by PCP that he had a DVT in right leg. CTA shows PE with right heart strain. Patient not on anticoagulation PTA. CBC and renal function is stable.   Initial heparin level: 0.10 on 1100 units/hr, RN reports possible pump issue, channel replaced and no infusion issues since. No overt s/sx of bleeding reported.   Goal of Therapy:  Heparin level 0.3-0.7 units/ml Monitor platelets by anticoagulation protocol: Yes   Plan:  Give 2000 units bolus x 1 Increase heparin infusion (~4 units/kg/hr) to 1350 units/hr Check anti-Xa level in 6 hours and daily while on heparin Continue to monitor H&H and platelets  03/26/22, PharmD Clinical Pharmacist 03/24/2022 2:18 AM Please check AMION for all [facility] Pharmacy numbers

## 2022-03-24 NOTE — Progress Notes (Signed)
ANTICOAGULATION CONSULT NOTE - Follow Up Consult  Pharmacy Consult for heparin  Indication: pulmonary embolus  Allergies  Allergen Reactions   Bee Venom Anaphylaxis    Patient Measurements: Height: 5\' 9"  (175.3 cm) Weight: 69.8 kg (153 lb 14.1 oz) IBW/kg (Calculated) : 70.7 Heparin Dosing Weight: 69.8kg    Vital Signs: Temp: 98.8 F (37.1 C) (12/22 1217) Temp Source: Oral (12/22 1217) BP: 132/94 (12/22 1116) Pulse Rate: 86 (12/22 1116)  Labs: Recent Labs    03/23/22 1200 03/23/22 1701 03/24/22 0105 03/24/22 0824 03/24/22 1256  HGB 13.4  --   --  13.2  --   HCT 38.9*  --   --  37.7*  --   PLT 372  --   --  303  --   HEPARINUNFRC  --   --  0.10*  --  0.28*  CREATININE 0.90  --   --  0.98  --   TROPONINIHS 22* 20*  --   --   --      Estimated Creatinine Clearance: 78.1 mL/min (by C-G formula based on SCr of 0.98 mg/dL).   Medical History: Past Medical History:  Diagnosis Date   Coronary artery disease    Hyperlipidemia    Hypertension     Assessment: Patient presenting from home says he was told by PCP that he had a DVT in right leg. CTA shows PE with right heart strain. Patient not on anticoagulation PTA. CBC and renal function is stable.   Heparin level subtherapeutic at 0.28 while on 1350u/hr. CBC stable.   Goal of Therapy:  Heparin level 0.3-0.7 units/ml Monitor platelets by anticoagulation protocol: Yes   Plan:  Increase heparin infusion to 1500 u/hr.  Check anti-Xa level in 6 hours and daily while on heparin Continue to monitor H&H and platelets  03/26/22, PharmD, BCCCP  03/24/2022,1:50 PM

## 2022-03-24 NOTE — ED Notes (Signed)
Stuck patient in his hand couldn't get the blood notified RN

## 2022-03-24 NOTE — Progress Notes (Incomplete)
  Echocardiogram 2D Echocardiogram has been performed.  Ricardo [name] 03/24/2022, 2:52 PM

## 2022-03-25 DIAGNOSIS — I2694 Multiple subsegmental pulmonary emboli without acute cor pulmonale: Secondary | ICD-10-CM

## 2022-03-25 DIAGNOSIS — N179 Acute kidney failure, unspecified: Secondary | ICD-10-CM

## 2022-03-25 DIAGNOSIS — E876 Hypokalemia: Secondary | ICD-10-CM

## 2022-03-25 DIAGNOSIS — I82411 Acute embolism and thrombosis of right femoral vein: Secondary | ICD-10-CM | POA: Diagnosis not present

## 2022-03-25 LAB — CBC
HCT: 32.4 % — ABNORMAL LOW (ref 39.0–52.0)
Hemoglobin: 11.4 g/dL — ABNORMAL LOW (ref 13.0–17.0)
MCH: 35.7 pg — ABNORMAL HIGH (ref 26.0–34.0)
MCHC: 35.2 g/dL (ref 30.0–36.0)
MCV: 101.6 fL — ABNORMAL HIGH (ref 80.0–100.0)
Platelets: 203 10*3/uL (ref 150–400)
RBC: 3.19 MIL/uL — ABNORMAL LOW (ref 4.22–5.81)
RDW: 13.5 % (ref 11.5–15.5)
WBC: 12.1 10*3/uL — ABNORMAL HIGH (ref 4.0–10.5)
nRBC: 0 % (ref 0.0–0.2)

## 2022-03-25 LAB — COMPREHENSIVE METABOLIC PANEL
ALT: 96 U/L — ABNORMAL HIGH (ref 0–44)
AST: 136 U/L — ABNORMAL HIGH (ref 15–41)
Albumin: 2.5 g/dL — ABNORMAL LOW (ref 3.5–5.0)
Alkaline Phosphatase: 85 U/L (ref 38–126)
Anion gap: 7 (ref 5–15)
BUN: 12 mg/dL (ref 8–23)
CO2: 29 mmol/L (ref 22–32)
Calcium: 8.4 mg/dL — ABNORMAL LOW (ref 8.9–10.3)
Chloride: 102 mmol/L (ref 98–111)
Creatinine, Ser: 1.37 mg/dL — ABNORMAL HIGH (ref 0.61–1.24)
GFR, Estimated: 59 mL/min — ABNORMAL LOW (ref >60)
Glucose, Bld: 116 mg/dL — ABNORMAL HIGH (ref 70–99)
Potassium: 3.3 mmol/L — ABNORMAL LOW (ref 3.5–5.1)
Sodium: 138 mmol/L (ref 135–145)
Total Bilirubin: 0.6 mg/dL (ref 0.3–1.2)
Total Protein: 5.4 g/dL — ABNORMAL LOW (ref 6.5–8.1)

## 2022-03-25 LAB — HEPATITIS PANEL, ACUTE
HCV Ab: NONREACTIVE
Hep A IgM: NONREACTIVE
Hep B C IgM: NONREACTIVE
Hepatitis B Surface Ag: NONREACTIVE

## 2022-03-25 LAB — HEPARIN LEVEL (UNFRACTIONATED): Heparin Unfractionated: 0.53 IU/mL (ref 0.30–0.70)

## 2022-03-25 LAB — HIV ANTIBODY (ROUTINE TESTING W REFLEX): HIV Screen 4th Generation wRfx: NONREACTIVE

## 2022-03-25 MED ORDER — APIXABAN (ELIQUIS) VTE STARTER PACK (10MG AND 5MG): ORAL_TABLET | ORAL | 0 refills | Status: DC

## 2022-03-25 MED ORDER — APIXABAN (ELIQUIS) VTE STARTER PACK (10MG AND 5MG)
ORAL_TABLET | ORAL | 0 refills | Status: DC
  Filled 2022-03-25: qty 1, fill #0

## 2022-03-25 MED ORDER — APIXABAN 5 MG PO TABS: 5.0000 mg | ORAL_TABLET | Freq: Two times a day (BID) | ORAL | Status: DC

## 2022-03-25 MED ORDER — APIXABAN (ELIQUIS) VTE STARTER PACK (10MG AND 5MG): ORAL_TABLET | ORAL | 0 refills | Status: AC

## 2022-03-25 MED ORDER — APIXABAN 5 MG PO TABS
10.0000 mg | ORAL_TABLET | Freq: Two times a day (BID) | ORAL | Status: DC
  Administered 2022-03-25: 10 mg via ORAL
  Filled 2022-03-25: qty 2

## 2022-03-25 MED ORDER — POTASSIUM CHLORIDE CRYS ER 20 MEQ PO TBCR
40.0000 meq | EXTENDED_RELEASE_TABLET | Freq: Two times a day (BID) | ORAL | Status: DC
  Administered 2022-03-25: 40 meq via ORAL
  Filled 2022-03-25: qty 2

## 2022-03-25 MED ORDER — APIXABAN (ELIQUIS) VTE STARTER PACK (10MG AND 5MG)
ORAL_TABLET | ORAL | 0 refills | Status: DC
  Filled 2022-03-25: qty 74, fill #0

## 2022-03-25 NOTE — Progress Notes (Signed)
Pt was unable to tolerate Potassium Phosphate gtt at full rate 85 ml/hr due to burning sensation on IV site on right AC. No extravasation or any signs of phlebitis on assessment.  We reduced the rate to 40 ml/hr, therefore the medication completion was delay till 07:00 am.   Hemodynamically stable, afebrile, no respiratory distress noted, able to rest well tonight. We will monitor.  Filiberto Pinks, RN

## 2022-03-25 NOTE — Progress Notes (Signed)
Patient given discharge instructions. PIVs removed. Telemetry box removed, CCMD notified. Patient taken in wheelchair by staff to vehicle.  Kenard Gower, RN

## 2022-03-25 NOTE — TOC CM/SW Note (Signed)
Mr. Guyette to discharge home today. [facility] able to assist with locating pharmacy that has Eliquis starter pack in stock. Gave Mr. Terhune address of [facility] on [address]. Made nursing aware.  No further TOC needs identified.    Raiford Noble, MSN, RN,BSN [facility] Post Acute Care Coordinator [phone] (Direct dial)

## 2022-03-25 NOTE — Discharge Summary (Signed)
Physician Discharge Summary  Patient: Ricardo [name] CurrentRoy L Zweber NWG:[id number]:[id number] DOB: 05/29/1960   Code Status: Full Code Admit date: 03/23/2022 Discharge date: 03/25/2022 Disposition: Home, No home health services recommended PCP: [facility]  Recommendations for Outpatient Follow-up:  Follow up with PCP within 1-2 weeks Regarding general hospital follow up and preventative care Recommend CBC to monitor hgb Recommend CMP to monitor kidney function, electrolytes, and liver enzymes   Discharge Diagnoses:  Principal Problem:   Acute pulmonary embolism (HCC) Active Problems:   Uncomplicated alcohol dependence (HCC)   PAD (peripheral artery disease) (HCC)   COPD (chronic obstructive pulmonary disease) (HCC)   Essential hypertension   H/O heart artery stent   Hyperlipidemia   OSA on CPAP   DVT (deep venous thrombosis) (HCC)   Hypokalemia   AKI (acute kidney injury) (HCC)   Multiple subsegmental pulmonary emboli without acute cor pulmonale [facility](HCC)  Brief Hospital Course Summary:  61 y.o. male with medical history significant of hypertension, hyperlipidemia, PAD, CAD, depression, COPD, OSA, alcohol tobacco abuse admitted on 03/23/2022 with acute right lower extremity DVT and pulmonary embolism. Unknown triggers.  He was started on heparin gtt and remained hemodynamically stable.   Echo was unremarkable and negative for signs of cor pulmonale.  On day of discharge, he states that he had no dyspnea or chest pain with exertion ORA.  He was transitioned to eliquis.   Additionally, he developed an AKI likely from hypoperfusion. Cr 1.37 from baseline of 0.9. I recommended he stay in the hospital for further treatment and monitoring but he declined based on personal reasons.  He endorsed that he would get a close follow up with his PCP to recheck his labs.  Return precautions were discussed.  All other chronic conditions were treated with home medications.    Discharge Condition: Stable, improved Recommended discharge diet: Regular healthy diet  Consultations: None   Procedures/Studies: Heparin gtt  Discharge Instructions     Discharge patient   Complete by: As directed    Discharge disposition: 01-Home or Self Care   Discharge patient date: 03/25/2022      Allergies as of 03/25/2022       Reactions   Bee Venom Anaphylaxis        Medication List     TAKE these medications    albuterol 108 (90 Base) MCG/ACT inhaler Commonly known as: VENTOLIN HFA Inhale 1-2 puffs into the lungs every 6 (six) hours as needed for wheezing or shortness of breath.   amLODipine 5 MG tablet Commonly known as: NORVASC Take 5 mg by mouth every evening.   Apixaban Starter Pack (10mg  and 5mg ) Commonly known as: ELIQUIS STARTER PACK Take as directed on package: start with two-5mg  tablets twice daily for 7 days. On day 8, switch to one-5mg  tablet twice daily.   aspirin EC 81 MG tablet Take 1 tablet (81 mg total) by mouth daily. Swallow whole.   atorvastatin 40 MG tablet Commonly known as: LIPITOR Take 40 mg by mouth in the morning.   cyclobenzaprine 10 MG tablet Commonly known as: FLEXERIL Take 10 mg by mouth 3 (three) times daily as needed for muscle spasms.   doxepin 10 MG capsule Commonly known as: SINEQUAN Take 10 mg by mouth at bedtime.   esomeprazole 40 MG capsule Commonly known as: NEXIUM Take 40 mg by mouth daily before breakfast.   hydrOXYzine 25 MG tablet Commonly known as: ATARAX Take 25 mg by mouth 3 (three) times daily as  needed for anxiety.   lisinopril 20 MG tablet Commonly known as: ZESTRIL Take 1 tablet (20 mg total) by mouth daily. For high blood pressure   metoprolol succinate 25 MG 24 hr tablet Commonly known as: TOPROL-XL Take 25 mg by mouth in the morning.   mirtazapine 15 MG tablet Commonly known as: REMERON Take 15 mg by mouth daily as needed (Sleep).   naltrexone 50 MG tablet Commonly known as:  DEPADE Take 50 mg by mouth daily.   Oxycodone HCl 10 MG Tabs Take 10 mg by mouth in the morning and at bedtime.   rOPINIRole 0.25 MG tablet Commonly known as: REQUIP Take 0.25 mg by mouth at bedtime as needed (for restless leg).       Subjective   Pt reports feeling well and needs to leave the hospital today. He doesn't go into detail but states that he will leave today even if AMA. He understands the possible cons of leaving before ready. He states he has no chest pain, hemoptysis, or dyspnea on exertion.   All questions and concerns were addressed at time of discharge.  Objective  Blood pressure (!) 149/106, pulse 85, temperature 98 F (36.7 C), temperature source Oral, resp. rate 15, height  (1.753 m), weight 69.8 kg, SpO2 97 %.   General: Pt is alert, awake, not in acute distress Cardiovascular: RRR, S1/S2 +, no rubs, no gallops Respiratory: CTA bilaterally, no wheezing, no rhonchi Abdominal: Soft, NT, ND, bowel sounds + Extremities: no edema, no cyanosis  The results of significant diagnostics from this hospitalization (including imaging, microbiology, ancillary and laboratory) are listed below for reference.   Imaging studies: ECHOCARDIOGRAM COMPLETE  Result Date: 03/24/2022    ECHOCARDIOGRAM REPORT   Patient Name:   Ricardo [name]  Date of Exam: 03/24/2022 Medical Rec #:  [id number]  Height:       69.0 in Accession #:    [id number] Weight:       153.9 lb Date of Birth:  1961/01/17   BSA:          1.848 m Patient Age:    61 years   BP:           132/94 mmHg Patient Gender: M          HR:           76 bpm. Exam Location:  Inpatient Procedure: 2D Echo, Cardiac Doppler and Color Doppler Indications:    Pulmonary Embolus I26.09  History:        Patient has no prior history of Echocardiogram examinations. PAD                 and COPD; Risk Factors:Hypertension, Sleep Apnea and                 Dyslipidemia. H/O heart artery stent 11/26/2017.  Sonographer:    Lucendia Herrlich Referring  Phys: Cecille Po MELVIN IMPRESSIONS  1. Left ventricular ejection fraction, by estimation, is 50 to 55%. The left ventricle has low normal function. The left ventricle has no regional wall motion abnormalities. Left ventricular diastolic parameters are consistent with Grade I diastolic dysfunction (impaired relaxation).  2. Right ventricular systolic function is normal. The right ventricular size is normal. Tricuspid regurgitation signal is inadequate for assessing PA pressure.  3. No evidence of mitral valve regurgitation.  4. The aortic valve was not well visualized. Aortic valve regurgitation is not visualized.  5. The inferior vena cava is normal in size with greater  than 50% respiratory variability, suggesting right atrial pressure of 3 mmHg. Comparison(s): No prior Echocardiogram. FINDINGS  Left Ventricle: Left ventricular ejection fraction, by estimation, is 50 to 55%. The left ventricle has low normal function. The left ventricle has no regional wall motion abnormalities. The left ventricular internal cavity size was normal in size. There is no left ventricular hypertrophy. Left ventricular diastolic parameters are consistent with Grade I diastolic dysfunction (impaired relaxation). Right Ventricle: The right ventricular size is normal. Right ventricular systolic function is normal. Tricuspid regurgitation signal is inadequate for assessing PA pressure. Left Atrium: Left atrial size was normal in size. Right Atrium: Right atrial size was normal in size. Pericardium: There is no evidence of pericardial effusion. Mitral Valve: No evidence of mitral valve regurgitation. Tricuspid Valve: Tricuspid valve regurgitation is not demonstrated. Aortic Valve: The aortic valve was not well visualized. Aortic valve regurgitation is not visualized. Aortic valve mean gradient measures 4.0 mmHg. Aortic valve peak gradient measures 7.7 mmHg. Aortic valve area, by VTI measures 2.20 cm. Pulmonic Valve: Pulmonic valve  regurgitation is not visualized. Aorta: The aortic root and ascending aorta are structurally normal, with no evidence of dilitation. Venous: The inferior vena cava is normal in size with greater than 50% respiratory variability, suggesting right atrial pressure of 3 mmHg. IAS/Shunts: No atrial level shunt detected by color flow Doppler.  LEFT VENTRICLE PLAX 2D LVIDd:         4.50 cm   Diastology LVIDs:         3.20 cm   LV e' medial:  6.96 cm/s LV PW:         1.00 cm   LV e' lateral: 8.59 cm/s LV IVS:        0.90 cm LVOT diam:     2.00 cm LV SV:         61 LV SV Index:   33 LVOT Area:     3.14 cm  3D Volume EF:                          3D EF:        50 %                          LV EDV:       136 ml                          LV ESV:       68 ml                          LV SV:        68 ml RIGHT VENTRICLE RV Basal diam:  3.20 cm RV S prime:     13.70 cm/s TAPSE (M-mode): 1.8 cm LEFT ATRIUM             Index        RIGHT ATRIUM           Index LA diam:        3.30 cm 1.79 cm/m   RA Area:     10.80 cm LA Vol (A2C):   44.3 ml 23.97 ml/m  RA Volume:   22.10 ml  11.96 ml/m LA Vol (A4C):   40.3 ml 21.81 ml/m LA Biplane Vol: 45.3 ml 24.51 ml/m  AORTIC VALVE AV Area (Vmax):  2.44 cm AV Area (Vmean):   2.21 cm AV Area (VTI):     2.20 cm AV Vmax:           139.00 cm/s AV Vmean:          95.700 cm/s AV VTI:            0.278 m AV Peak Grad:      7.7 mmHg AV Mean Grad:      4.0 mmHg LVOT Vmax:         108.00 cm/s LVOT Vmean:        67.300 cm/s LVOT VTI:          0.195 m LVOT/AV VTI ratio: 0.70  AORTA Ao Root diam: 3.70 cm  SHUNTS Systemic VTI:  0.20 m Systemic Diam: 2.00 cm Carolan Clines Electronically signed by Carolan Clines Signature Date/Time: 03/24/2022/3:23:01 PM    Final    VAS [place] LOWER EXTREMITY VENOUS (DVT) (7a-7p)  Result Date: 03/24/2022  Lower Venous DVT Study Patient Name:  THERMON ZULAUF Abdullah  Date of Exam:   03/23/2022 Medical Rec #: [id number]  Accession #:    [id number] Date of Birth: 03/25/1961   Patient Gender: M  Patient Age:   35 years Exam Location:  [facility] Procedure:      VAS [place] LOWER EXTREMITY VENOUS (DVT) Referring Phys: Alvino Blood --------------------------------------------------------------------------------  Indications: Chest pain, shortness of breath and right leg pain.  Comparison Study: No priors. Performing Technologist: Marilynne Halsted RDMS, RVT  Examination Guidelines: A complete evaluation includes B-mode imaging, spectral Doppler, color Doppler, and power Doppler as needed of all accessible portions of each vessel. Bilateral testing is considered an integral part of a complete examination. Limited examinations for reoccurring indications may be performed as noted. The reflux portion of the exam is performed with the patient in reverse Trendelenburg.  +---------+---------------+---------+-----------+----------+--------------+ RIGHT    CompressibilityPhasicitySpontaneityPropertiesThrombus Aging +---------+---------------+---------+-----------+----------+--------------+ CFV      Full           Yes      Yes                                 +---------+---------------+---------+-----------+----------+--------------+ SFJ      Full                                                        +---------+---------------+---------+-----------+----------+--------------+ FV Prox  None                                         Acute          +---------+---------------+---------+-----------+----------+--------------+ FV Mid   None                                         Acute          +---------+---------------+---------+-----------+----------+--------------+ FV DistalNone                                         Acute          +---------+---------------+---------+-----------+----------+--------------+  PFV      Full                                                        +---------+---------------+---------+-----------+----------+--------------+ POP      Partial         No       No                   Acute          +---------+---------------+---------+-----------+----------+--------------+ PTV      Partial                                      Acute          +---------+---------------+---------+-----------+----------+--------------+ PERO                                                  Acute          +---------+---------------+---------+-----------+----------+--------------+ REIV                    Yes      Yes                  patent         +---------+---------------+---------+-----------+----------+--------------+ RCIV                    Yes      Yes                  patent         +---------+---------------+---------+-----------+----------+--------------+     Summary: RIGHT: - Findings consistent with acute deep vein thrombosis involving the right femoral vein, right popliteal vein, right posterior tibial veins, and right peroneal veins.  LEFT: - No evidence of common femoral vein obstruction.  *See table(s) above for measurements and observations. Electronically signed by Waverly Ferrari MD on 03/24/2022 at 8:11:25 AM.    Final    CT Angio Chest PE W/Cm &/Or Wo Cm  Result Date: 03/23/2022 CLINICAL DATA:  Chest pain and shortness of breath. Right lower extremity DVT. EXAM: CT ANGIOGRAPHY CHEST WITH CONTRAST TECHNIQUE: Multidetector CT imaging of the chest was performed using the standard protocol during bolus administration of intravenous contrast. Multiplanar CT image reconstructions and MIPs were obtained to evaluate the vascular anatomy. RADIATION DOSE REDUCTION: This exam was performed according to the departmental dose-optimization program which includes automated exposure control, adjustment of the mA and/or kV according to patient size and/or use of iterative reconstruction technique. CONTRAST:  81mL OMNIPAQUE IOHEXOL 350 MG/ML SOLN COMPARISON:  CT chest dated March 17, 2022. FINDINGS: Cardiovascular: Satisfactory  opacification of the pulmonary arteries to the segmental level. Acute nonocclusive lobar and segmental pulmonary emboli in the bilateral upper and lower lobes. Normal heart size with elevated RV/LV ratio of 1.04. No pericardial effusion. No thoracic aortic aneurysm or dissection. Coronary and aortic arch atherosclerotic vascular disease. Mediastinum/Nodes: No enlarged mediastinal, hilar, or axillary lymph nodes. Thyroid gland, trachea, and esophagus demonstrate no significant findings. Lungs/Pleura: Prominent central peribronchial thickening. No focal consolidation, pleural effusion, or pneumothorax.  Very mild centrilobular emphysema. Upper Abdomen: No acute abnormality. Unchanged diffuse severe hepatic steatosis. Musculoskeletal: No chest wall abnormality. No acute or significant osseous findings. Old healed bilateral rib fractures. Review of the MIP images confirms the above findings. IMPRESSION: 1. Acute nonocclusive lobar and segmental pulmonary emboli in the bilateral upper and lower lobes. Positive for acute PE with CT evidence of right heart strain (RV/LV Ratio = 1.04 ) consistent with at least submassive (intermediate risk) PE. The presence of right heart strain has been associated with an increased risk of morbidity and mortality. Please refer to the "Code PE Focused" order set in EPIC. 2. Unchanged severe hepatic steatosis. 3. Aortic Atherosclerosis (ICD10-I70.0) and Emphysema (ICD10-J43.9). Critical Value/emergent results were called by telephone at the time of interpretation on 03/23/2022 at 5:37 pm to provider [facility], who verbally acknowledged these results. Electronically Signed   By: Obie Dredge M.D.   On: 03/23/2022 17:40   DG Chest 2 View  Result Date: 03/23/2022 CLINICAL DATA:  Chest pain and short of breath.  DVT EXAM: CHEST - 2 VIEW COMPARISON:  Chest 02/12/2021 FINDINGS: The heart size and mediastinal contours are within normal limits. Both lungs are clear. The visualized  skeletal structures are unremarkable. IMPRESSION: No active cardiopulmonary disease. Electronically Signed   By: Marlan Palau M.D.   On: 03/23/2022 13:09    Labs: Basic Metabolic Panel: Recent Labs  Lab 03/23/22 1200 03/23/22 1701 03/24/22 0824 03/25/22 0111  NA 139  --  140 138  K 2.4*  --  2.9* 3.3*  CL 99  --  94* 102  CO2 27  --  29 29  GLUCOSE 111*  --  94 116*  BUN 6*  --  9 12  CREATININE 0.90  --  0.98 1.37*  CALCIUM 9.0  --  9.3 8.4*  MG  --  1.1* 1.0*  --   PHOS  --   --  1.5*  --    CBC: Recent Labs  Lab 03/23/22 1200 03/24/22 0824 03/25/22 0111  WBC 13.6* 13.9* 12.1*  NEUTROABS 9.8*  --   --   HGB 13.4 13.2 11.4*  HCT 38.9* 37.7* 32.4*  MCV 100.3* 100.3* 101.6*  PLT 372 303 203   Microbiology: No results found for this or any previous visit.   Time coordinating discharge: Over 30 minutes  Leeroy Bock, MD  [facility] 03/25/2022, 1:08 PM

## 2022-03-25 NOTE — Discharge Instructions (Signed)
I highly recommend staying another night to monitor your AKI (kidney injury) and manage your electrolytes but understand you want to leave today. Please pick up your medications from pharmacy today and take the new blood thinner as directed.  I request that you get the earliest appointment available with your PCP to recheck your blood work for hemoglobin, electrolytes, and kidney function. Return to the hospital if you have sudden worsening shortness of breath, chest pain, coughing blood, or leg swelling.

## 2022-03-27 ENCOUNTER — Other Ambulatory Visit (HOSPITAL_COMMUNITY): Payer: Self-pay

## 2022-03-28 ENCOUNTER — Other Ambulatory Visit (HOSPITAL_COMMUNITY): Payer: Self-pay

## 2023-10-11 DIAGNOSIS — I4581 Long QT syndrome: Secondary | ICD-10-CM | POA: Diagnosis not present

## 2023-10-12 DIAGNOSIS — I4581 Long QT syndrome: Secondary | ICD-10-CM | POA: Diagnosis not present

## 2023-10-17 ENCOUNTER — Encounter: Payer: Self-pay | Admitting: Internal Medicine
